# Patient Record
Sex: Female | Born: 1965 | Race: Black or African American | Hispanic: No | Marital: Single | Smoking: Never smoker
Health system: Southern US, Community
[De-identification: ages and names within clinical notes are randomized; demographics above are authoritative.]

## PROBLEM LIST (undated history)

## (undated) DIAGNOSIS — E049 Nontoxic goiter, unspecified: Secondary | ICD-10-CM

## (undated) HISTORY — PX: ABLATION ON ENDOMETRIOSIS: SHX5787

---

## 1998-03-31 ENCOUNTER — Other Ambulatory Visit: Admission: RE | Admit: 1998-03-31 | Discharge: 1998-03-31 | Payer: Self-pay | Admitting: Family Medicine

## 1998-09-28 ENCOUNTER — Emergency Department (HOSPITAL_COMMUNITY): Admission: EM | Admit: 1998-09-28 | Discharge: 1998-09-28 | Payer: Self-pay | Admitting: Emergency Medicine

## 1998-10-11 ENCOUNTER — Other Ambulatory Visit: Admission: RE | Admit: 1998-10-11 | Discharge: 1998-10-11 | Payer: Self-pay | Admitting: Obstetrics

## 1999-04-05 ENCOUNTER — Other Ambulatory Visit: Admission: RE | Admit: 1999-04-05 | Discharge: 1999-04-05 | Payer: Self-pay | Admitting: Obstetrics and Gynecology

## 1999-04-09 ENCOUNTER — Encounter (INDEPENDENT_AMBULATORY_CARE_PROVIDER_SITE_OTHER): Payer: Self-pay | Admitting: *Deleted

## 1999-04-09 ENCOUNTER — Ambulatory Visit (HOSPITAL_COMMUNITY): Admission: RE | Admit: 1999-04-09 | Discharge: 1999-04-09 | Payer: Self-pay | Admitting: Obstetrics and Gynecology

## 1999-10-07 ENCOUNTER — Encounter: Payer: Self-pay | Admitting: Emergency Medicine

## 1999-10-07 ENCOUNTER — Emergency Department (HOSPITAL_COMMUNITY): Admission: EM | Admit: 1999-10-07 | Discharge: 1999-10-07 | Payer: Self-pay | Admitting: Emergency Medicine

## 2000-05-16 ENCOUNTER — Other Ambulatory Visit: Admission: RE | Admit: 2000-05-16 | Discharge: 2000-05-16 | Payer: Self-pay | Admitting: Obstetrics and Gynecology

## 2000-07-09 ENCOUNTER — Ambulatory Visit (HOSPITAL_COMMUNITY): Admission: RE | Admit: 2000-07-09 | Discharge: 2000-07-09 | Payer: Self-pay | Admitting: Obstetrics and Gynecology

## 2000-11-27 ENCOUNTER — Inpatient Hospital Stay (HOSPITAL_COMMUNITY): Admission: AD | Admit: 2000-11-27 | Discharge: 2000-11-29 | Payer: Self-pay | Admitting: Obstetrics and Gynecology

## 2001-01-14 ENCOUNTER — Ambulatory Visit (HOSPITAL_COMMUNITY): Admission: RE | Admit: 2001-01-14 | Discharge: 2001-01-14 | Payer: Self-pay | Admitting: Obstetrics and Gynecology

## 2004-10-07 ENCOUNTER — Ambulatory Visit (HOSPITAL_COMMUNITY): Admission: RE | Admit: 2004-10-07 | Discharge: 2004-10-07 | Payer: Self-pay | Admitting: Obstetrics & Gynecology

## 2005-02-12 ENCOUNTER — Emergency Department (HOSPITAL_COMMUNITY): Admission: EM | Admit: 2005-02-12 | Discharge: 2005-02-12 | Payer: Self-pay | Admitting: Family Medicine

## 2005-05-23 ENCOUNTER — Emergency Department (HOSPITAL_COMMUNITY): Admission: EM | Admit: 2005-05-23 | Discharge: 2005-05-23 | Payer: Self-pay | Admitting: Family Medicine

## 2005-06-02 ENCOUNTER — Emergency Department (HOSPITAL_COMMUNITY): Admission: EM | Admit: 2005-06-02 | Discharge: 2005-06-02 | Payer: Self-pay | Admitting: Emergency Medicine

## 2005-07-18 ENCOUNTER — Emergency Department (HOSPITAL_COMMUNITY): Admission: EM | Admit: 2005-07-18 | Discharge: 2005-07-18 | Payer: Self-pay | Admitting: Family Medicine

## 2005-07-30 ENCOUNTER — Emergency Department (HOSPITAL_COMMUNITY): Admission: EM | Admit: 2005-07-30 | Discharge: 2005-07-30 | Payer: Self-pay | Admitting: Family Medicine

## 2006-02-15 ENCOUNTER — Emergency Department (HOSPITAL_COMMUNITY): Admission: EM | Admit: 2006-02-15 | Discharge: 2006-02-15 | Payer: Self-pay | Admitting: Emergency Medicine

## 2006-03-16 ENCOUNTER — Emergency Department (HOSPITAL_COMMUNITY): Admission: EM | Admit: 2006-03-16 | Discharge: 2006-03-16 | Payer: Self-pay | Admitting: Emergency Medicine

## 2006-09-08 ENCOUNTER — Emergency Department (HOSPITAL_COMMUNITY): Admission: EM | Admit: 2006-09-08 | Discharge: 2006-09-08 | Payer: Self-pay | Admitting: Family Medicine

## 2007-01-30 ENCOUNTER — Emergency Department (HOSPITAL_COMMUNITY): Admission: EM | Admit: 2007-01-30 | Discharge: 2007-01-30 | Payer: Self-pay | Admitting: Emergency Medicine

## 2007-04-04 ENCOUNTER — Encounter (INDEPENDENT_AMBULATORY_CARE_PROVIDER_SITE_OTHER): Payer: Self-pay | Admitting: Obstetrics & Gynecology

## 2007-04-04 ENCOUNTER — Ambulatory Visit: Payer: Self-pay | Admitting: Obstetrics & Gynecology

## 2007-06-06 ENCOUNTER — Emergency Department (HOSPITAL_COMMUNITY): Admission: EM | Admit: 2007-06-06 | Discharge: 2007-06-06 | Payer: Self-pay | Admitting: Emergency Medicine

## 2007-09-25 ENCOUNTER — Emergency Department (HOSPITAL_COMMUNITY): Admission: EM | Admit: 2007-09-25 | Discharge: 2007-09-25 | Payer: Self-pay | Admitting: Emergency Medicine

## 2008-03-13 ENCOUNTER — Other Ambulatory Visit: Admission: RE | Admit: 2008-03-13 | Discharge: 2008-03-13 | Payer: Self-pay | Admitting: Family Medicine

## 2008-08-27 ENCOUNTER — Emergency Department (HOSPITAL_COMMUNITY): Admission: EM | Admit: 2008-08-27 | Discharge: 2008-08-28 | Payer: Self-pay | Admitting: Emergency Medicine

## 2010-04-24 ENCOUNTER — Emergency Department (HOSPITAL_COMMUNITY): Admission: EM | Admit: 2010-04-24 | Discharge: 2010-04-24 | Payer: Self-pay | Admitting: Family Medicine

## 2010-08-07 ENCOUNTER — Emergency Department (HOSPITAL_COMMUNITY)
Admission: EM | Admit: 2010-08-07 | Discharge: 2010-08-07 | Payer: Self-pay | Source: Home / Self Care | Admitting: Family Medicine

## 2010-10-06 ENCOUNTER — Ambulatory Visit (HOSPITAL_COMMUNITY)
Admission: RE | Admit: 2010-10-06 | Discharge: 2010-10-06 | Payer: Self-pay | Source: Home / Self Care | Attending: Obstetrics and Gynecology | Admitting: Obstetrics and Gynecology

## 2010-12-19 LAB — CBC
HCT: 41.1 % (ref 36.0–46.0)
MCHC: 32.4 g/dL (ref 30.0–36.0)
Platelets: 217 10*3/uL (ref 150–400)
RBC: 5.29 MIL/uL — ABNORMAL HIGH (ref 3.87–5.11)
RDW: 14.6 % (ref 11.5–15.5)
WBC: 6.8 10*3/uL (ref 4.0–10.5)

## 2010-12-19 LAB — T3, FREE: T3, Free: 3.3 pg/mL (ref 2.3–4.2)

## 2010-12-19 LAB — PREGNANCY, URINE: Preg Test, Ur: NEGATIVE

## 2010-12-19 LAB — T4, FREE: Free T4: 1.04 ng/dL (ref 0.80–1.80)

## 2011-02-21 NOTE — Group Therapy Note (Signed)
Tiffany Moyer, MANOCCHIO NO.:  [id number]   MEDICAL RECORD NO.:  [id number]          PATIENT TYPE:  WOC   LOCATION:  [facility]                   FACILITY:  [facility]   PHYSICIAN:  Dorthula Perfect, MD     DATE OF BIRTH:  1966-10-06   DATE OF SERVICE:  04/04/2007                                  CLINIC NOTE   This is a 45 year old black female, gravida 3, para 3, last menstrual  period May of 2007.  She is here for annual exam.  Her complaint is that  of vaginal irritation.   She has had tubal sterilization done.  Menstrual periods are not a  problem.  She, however, does have a lot of vaginal irritation.  Certain  soaps irritate her, etc.   PHYSICAL EXAMINATION:  Height 5 feet 4 inches, weight 164, blood  pressure 104/68.  ABDOMEN:  Soft, nontender.  No masses felt.  PELVIC EXAM:  External genitalia normal.  There are no lesions on the  vulva.  Vaginal vault epithelized.  There is a little bit of a white  discharge.  Cervic is epithelized.  Uterus is of normal size and shape.  Adnexa structures are normal.   Wet prep is done.  It shows a few clue cells.   DISPOSITION:  1. Vaginal irritation.  Bacterial vaginosis.  2. Thin prep Pap smear.  3. Prescription for metronidazole 500 mg tablets 14 to take one twice      a day.  4. The patient is advised to stop using dryer sheets, instead use      fabric softener liquid.  She is advised to find a soap that does      not irritate her and stick with that.  She is to be sure to use      cotton-crotch underwear, and to keep all chemicals away from her      bottom.           ______________________________  Dorthula Perfect, MD     ER/MEDQ  D:  04/04/2007  T:  04/04/2007  Job:  [id number]

## 2011-02-24 NOTE — Op Note (Signed)
[facility]  Patient:    Tiffany Moyer, Tiffany Moyer               MRN: [id number] Proc. Date: 01/14/01 Adm. Date:  [id number] Attending:  Michaele Offer                           Operative Report  PREOPERATIVE DIAGNOSIS:  Desired surgical sterility.  POSTOPERATIVE DIAGNOSES:  Desired surgical sterility and pelvic and abdominal adhesions.  PROCEDURE:  Laparoscopic bilateral tubal fulguration.  SURGEON:  Dr. Lavina Hamman.  ANESTHESIA:  General endotracheal tube.  ESTIMATED BLOOD LOSS:  Less than 50 cc.  FINDINGS:  Normal uterus, tubes and ovaries with filmy adhesions of the tubes to the ovaries. The sigmoid colon was adherent to the left pelvic side wall and there were adhesions around her liver and she had a normal gallbladder.  COUNTS:  Correct.  CONDITION:  Stable.  DESCRIPTION OF PROCEDURE:  The patient was taken to the operating room and placed in the dorsal supine position. General anesthesia was induced and she was placed in mobile stirrups. Her abdomen was then prepped and draped in the usual sterile fashion for a laparoscopic procedure, her bladder drained with a red rubber catheter, and a Hulka tenaculum applied to her cervix for uterine manipulation. Her infraumbilical skin was then infiltrated with 0.25% Marcaine along her previous scar. A 1.5 cm horizontal incision was made in this previous scar and the Veress needle was inserted into the peritoneal cavity. Placement confirmed by the water drop test and an opening pressure of 4 mmHg. Two and a half liters of CO2 gas were insufflated and the Veress needle was removed. The 10/11 disposable trocar was then introduced and placement confirmed by the laparoscope. Inspection of the abdomen and pelvis revealed the above mentioned findings. Both fallopian tubes were identified and traced to their fimbriated ends. The middle portion of each fallopian tube was desiccated with a bipolar  cautery to achieve tubal fulguration. This was done in three spots to desiccate a 2-3 cm portion of the middle of each tube. This was done with good results and was done in all spots until the amp meter read zero. There was no bleeding. The procedure was then terminated. All gas was allowed to deflate from the abdomen and the trocar was removed. The fascia was reapproximated with #0 Vicryl and the skin reapproximated with interrupted subcuticular sutures of 4-0 Vicryl followed by Steri-Strips and band-aids. The Hulka tenaculum was then removed. The patient was extubated in the operating room and taken to the recovery room in stable condition after tolerating the procedure well. DD:  01/14/01 TD:  01/14/01 Job: [id number]

## 2011-02-24 NOTE — Discharge Summary (Signed)
[facility]  Patient:    Tiffany Moyer, Tiffany Moyer                      MRN: [id number] Adm. Date:  [id number] Disc. Date: 11/29/00 Attending:  Michaele Offer                           Discharge Summary  ADMISSION DIAGNOSES:          Intrauterine pregnancy at 38 weeks, group B strep carrier, advanced maternal age.  DISCHARGE DIAGNOSES:          Intrauterine pregnancy at 38 weeks, group B strep carrier, advanced maternal age.  PROCEDURE:                    Spontaneous vaginal delivery.  COMPLICATIONS:                None.  CONSULTATIONS:                None.  HISTORY AND PHYSICAL:         This is a 45 year old black female gravida 5, para 2-0-2-2 with an EGA of [redacted] weeks by an LMP consistent with an 8 week ultrasound with an EDC of March 2 who presents with complaint of regular contractions, no vaginal bleeding or ruptured membranes, and good fetal movement.  Vaginal examination in the office revealed her to be 3, 80, and -1. Prenatal care complicated by a first trimester UTI with Klebsiella which was treated with Bactrim and she had a negative test of cure.  She had a questionable exposure to parvo virus B19 with negative IgG and IgM titers x 2. She is advanced maternal age and declined amniocentesis and had a normal ultrasound and normal triple screen.  PRENATAL LABORATORIES:        Blood type A+ with a negative antibody screen. Sickle screen is negative.  RPR nonreactive.  Rubella immune.  Hepatitis B surface antigen negative.  HIV negative.  Gonorrhea and chlamydia negative. Triple screen normal.  One hour glucola 80.  Strep is positive.  PAST OBSTETRICAL HISTORY:     In 1994 vaginal delivery at 39 weeks 7 pounds 9 ounces, 1995 vaginal delivery at 39 weeks 8 pounds 2 ounces.  She had spontaneous abortions in 1999 and 2000.  PAST GYNECOLOGICAL HISTORY:   Remote history of gonorrhea and chlamydia. Remainder of her history is  noncontributory.  FAMILY HISTORY:               Father of the baby and one of the patients sons has hemoglobin AS.  PHYSICAL EXAMINATION:  VITAL SIGNS:                  Afebrile with stable vital signs.  Fetal heart tracing was reactive without decelerations and she was having contractions q.3-4 minutes.  ABDOMEN:                      Gravid, nontender.  Estimated fetal weight 7.5 pounds.  PELVIC:                       Vaginal examination:  3, 80, -1 with a vertex presentation and an adequate pelvis.  HOSPITAL COURSE:              Patient was admitted and started on penicillin for group B strep prophylaxis.  On my first examination  in the hospital artificial rupture of membranes was performed which revealed clear amniotic fluid.  She then required Pitocin augmentation and received an epidural.  She then progressed to complete and pushed well.  On the evening of February 19 she had a vaginal delivery of a viable female infant with Apgars of 9 and 9 that weighed 7 pounds 10 ounces over an intact perineum.  There was a nuchal cord x  which was reduced.  Placenta was spontaneous and was intact.  Perineum was intact.  Estimated blood loss was less than 500 cc.  Postpartum she did very well.  Bottle fed her baby without complications.  She had an initial temperature on the floor of 100.4, but other than that remained afebrile.  On the morning of postpartum day #2 she was felt to be stable enough for discharge home.  CONDITION ON DISCHARGE:       Stable.  DISPOSITION:                  Discharged to home.  DIET:                         Regular.  ACTIVITY:                     Pelvic rest.  FOLLOW-UP:                    Four to six weeks.  DISCHARGE MEDICATIONS:        Over-the-counter Tylenol and Motrin p.r.n.  She is given our discharge pamphlet.DD:  11/29/00 TD:  11/29/00 Job: [id number]

## 2011-07-12 LAB — URINE MICROSCOPIC-ADD ON

## 2011-07-12 LAB — URINALYSIS, ROUTINE W REFLEX MICROSCOPIC
Protein, ur: 30 — AB
Specific Gravity, Urine: 1.023
pH: 6

## 2011-07-12 LAB — PREGNANCY, URINE: Preg Test, Ur: NEGATIVE

## 2011-07-21 LAB — DIFFERENTIAL
Lymphs Abs: 1.3
Monocytes Absolute: 0.4
Monocytes Relative: 7
Neutro Abs: 4.8

## 2011-07-21 LAB — BASIC METABOLIC PANEL
BUN: 10
Calcium: 9.3
Creatinine, Ser: 0.76
GFR calc Af Amer: >60
Glucose, Bld: 88
Sodium: 140

## 2011-07-21 LAB — CBC
MCV: 78
Platelets: 227
RBC: 4.85
RDW: 14.3 — ABNORMAL HIGH
WBC: 6.6

## 2011-07-21 LAB — URINALYSIS, ROUTINE W REFLEX MICROSCOPIC
Leukocytes, UA: NEGATIVE
Nitrite: NEGATIVE
Protein, ur: NEGATIVE
Specific Gravity, Urine: 1.009
Urobilinogen, UA: 1

## 2011-07-21 LAB — URINE MICROSCOPIC-ADD ON

## 2011-07-21 LAB — PREGNANCY, URINE: Preg Test, Ur: NEGATIVE

## 2012-08-09 ENCOUNTER — Encounter (HOSPITAL_COMMUNITY): Payer: Self-pay | Admitting: Emergency Medicine

## 2012-08-09 ENCOUNTER — Emergency Department (INDEPENDENT_AMBULATORY_CARE_PROVIDER_SITE_OTHER)
Admission: EM | Admit: 2012-08-09 | Discharge: 2012-08-09 | Disposition: A | Discharge status: Home / Self Care | Payer: Self-pay | Source: Home / Self Care | Attending: Emergency Medicine | Admitting: Emergency Medicine

## 2012-08-09 DIAGNOSIS — E049 Nontoxic goiter, unspecified: Secondary | ICD-10-CM

## 2012-08-09 DIAGNOSIS — R3989 Other symptoms and signs involving the genitourinary system: Secondary | ICD-10-CM

## 2012-08-09 DIAGNOSIS — R399 Unspecified symptoms and signs involving the genitourinary system: Secondary | ICD-10-CM

## 2012-08-09 DIAGNOSIS — N898 Other specified noninflammatory disorders of vagina: Secondary | ICD-10-CM

## 2012-08-09 HISTORY — DX: Nontoxic goiter, unspecified: E04.9

## 2012-08-09 LAB — POCT URINALYSIS DIP (DEVICE)
Glucose, UA: NEGATIVE mg/dL
Nitrite: NEGATIVE
Protein, ur: NEGATIVE mg/dL
Urobilinogen, UA: 0.2 mg/dL (ref 0.0–1.0)

## 2012-08-09 LAB — POCT PREGNANCY, URINE: Preg Test, Ur: NEGATIVE

## 2012-08-09 MED ORDER — NITROFURANTOIN MONOHYD MACRO 100 MG PO CAPS: 100.0000 mg | ORAL_CAPSULE | Freq: Two times a day (BID) | ORAL | Status: AC

## 2012-08-09 MED ORDER — FLUCONAZOLE 200 MG PO TABS: 100.0000 mg | ORAL_TABLET | Freq: Once | ORAL | Status: DC

## 2012-08-09 MED ORDER — METRONIDAZOLE 500 MG PO TABS: 500.0000 mg | ORAL_TABLET | Freq: Two times a day (BID) | ORAL | Status: DC

## 2012-08-09 MED ORDER — FLUCONAZOLE 100 MG PO TABS: 100.0000 mg | ORAL_TABLET | Freq: Once | ORAL | Status: AC

## 2012-08-09 NOTE — ED Notes (Signed)
Instructed patient to slip on gown for physician exam.

## 2012-08-09 NOTE — ED Notes (Signed)
Patient reports she has a goiter diagnosed in march 2013. Patient has had neck pain since then . Patient was unable to go to 6 month check up ( insurance cancelled).  Neck pain has continued and now in shoulder, left chest, left upper back.  Reports pain is sharp, squeezing.  Patient is concerned goiter is larger than it was.  Second complaint is that she had a uterine ablation 2 years ago.  Lower abdominal cramping started 2-3 days ago.  Reports she has cramping for the past 2-3 months with no bleeding.

## 2012-08-09 NOTE — ED Notes (Signed)
Equipment at bedside. 

## 2012-08-09 NOTE — ED Provider Notes (Addendum)
History     CSN: [id number]  Arrival date & time 08/09/12  1331   First MD Initiated Contact with Patient 08/09/12 1453      Chief Complaint  Patient presents with  . Neck Pain    (Consider location/radiation/quality/duration/timing/severity/associated sxs/prior treatment) HPI Comments: Patient presents to urgent care this afternoon complaining of 3 different concerns and problems. Problem #1 is that lasts one to 2 weeks he describes a uncomfortable vaginal irritation and discharge with some discomfort and increased frequency with urination. She denies any fevers, flank pains nausea or vomiting. Second issue patient brings or attention is that she feels recorder that was diagnosed the beginning of this year is getting larger as she is continues to experience some neck discomfort since then. She has been seen by regional physician's group in [facility] but she has not come back for a followup appointment because of insurance lapse. She is aware that her office visit will cost her $120. " I thought of coming here because it was much cheaper". Patient also describes that she had a urine ablation 2 years ago and was told to followup with she never did. She denies any vaginal bleeding but does describe for the last 2 weeks have passed a vaginal discharge and irritation. Patient denies any systemic symptoms such as fevers general malaise. Although does admit that she's been feeling tired getting sick more frequently ( did not elaborate on how), but feels somewhat fatigued and tired, and most recently with mood swings.  Patient is a 46 y.o. female presenting with neck pain and vaginal discharge. The history is provided by the patient.  Neck Pain  This is a new problem. The problem occurs constantly.  Vaginal Discharge This is a new problem. The problem occurs constantly. The problem has not changed since onset.Pertinent negatives include no abdominal pain. The treatment provided no relief.    Past  Medical History  Diagnosis Date  . Goiter     Past Surgical History  Procedure Date  . Ablation on endometriosis     History reviewed. No pertinent family history.  History  Substance Use Topics  . Smoking status: Not on file  . Smokeless tobacco: Not on file  . Alcohol Use: No    OB History    Grav Para Term Preterm Abortions TAB SAB Ect Mult Living                  Review of Systems  Constitutional: Positive for activity change. Negative for fever and diaphoresis.  HENT: Positive for neck pain.   Gastrointestinal: Negative for abdominal pain.  Genitourinary: Positive for frequency and vaginal discharge. Negative for urgency, hematuria, decreased urine volume, vaginal pain and pelvic pain.  Musculoskeletal: Negative for back pain.  Skin: Negative for rash.    Allergies  Review of patient's allergies indicates no known allergies.  Home Medications   Current Outpatient Rx  Name Route Sig Dispense Refill  . NYQUIL PO Oral Take by mouth.    Marland Kitchen FLUCONAZOLE 100 MG PO TABS Oral Take 1 tablet (100 mg total) by mouth once. Can repeat treatment in 7 days 2 tablet 0  . METRONIDAZOLE 500 MG PO TABS Oral Take 1 tablet (500 mg total) by mouth 2 (two) times daily. 14 tablet 0  . NITROFURANTOIN MONOHYD MACRO 100 MG PO CAPS Oral Take 1 capsule (100 mg total) by mouth 2 (two) times daily. 10 capsule 0    BP 112/76  Pulse 73  Temp 97 F (  36.1 C) (Oral)  Resp 16  SpO2 100%  Physical Exam  Nursing note and vitals reviewed. Constitutional: Vital signs are normal. She appears well-developed and well-nourished.  Non-toxic appearance. She does not have a sickly appearance.  Genitourinary: There is no rash, tenderness or lesion on the right labia. There is no rash, tenderness or lesion on the left labia. Cervix exhibits no motion tenderness. No erythema, tenderness or bleeding around the vagina. No signs of injury around the vagina. Vaginal discharge found.    Skin: No rash noted.  No erythema.    ED Course  Procedures (including critical care time)  Labs Reviewed  POCT URINALYSIS DIP (DEVICE) - Abnormal; Notable for the following:    Hgb urine dipstick MODERATE (*)     Leukocytes, UA SMALL (*)  Biochemical Testing Only. Please order routine urinalysis from main lab if confirmatory testing is needed.   All other components within normal limits  POCT PREGNANCY, URINE  WET PREP, GENITAL  GC/CHLAMYDIA PROBE AMP, GENITAL  URINE CULTURE   No results found.   1. Vaginal discharge   2. Urinary tract infection symptoms   3. Goiter       MDM  Problem #1 previously diagnose thyroid goiter. Patient will call her primary care Dr. that had previously diagnose her and will agree to pay the $120 office visit. I have explained to patient that we would not be able to perform this workup that she needs and appetite he recommended that she continues with her primary care Dr. with this workup as previously recommended. At this point I don't think patient is having symptoms of hyperthyroid toxicosis, or is in any immediate danger although have explained to her that a malignant process in her thyroid needs to be excluded although unlikely. Problem #2 patient with urinary discomfort and vaginal discharge. A pelvic exam was performed where a whitish homogeneous with vaginal discharge was noted samples were obtained for both a wet prep and a DNA probe for chlamydia and gonorrhea. Patient will be treated with empiric O. course of Diflucan and Flagyl.  Problem #3 patient had a endometrial ablation, and was recommended to followup which she thought she could do here at urgent care today I have explained that to her that she needs to see the gynecologist to perform the endometrial ablation for follow-up. After this discussion patient agrees and will proceed to call her previous gynecologist.       Jimmie Molly, MD 08/09/12 1621  Jimmie Molly, MD 08/09/12 [phone]

## 2012-08-10 LAB — GC/CHLAMYDIA PROBE AMP, GENITAL: Chlamydia, DNA Probe: NEGATIVE

## 2012-08-12 NOTE — ED Notes (Signed)
UA culture shows no significant growth

## 2012-08-12 NOTE — ED Notes (Addendum)
Patient called, c/o perineal discomfort is worse, and what can she do for it. Spoke w Verline Lema, [place], who stated pt can use OTC  monistat externally for irritation and burning. Also discussed lab reports with patient; gc/chlamydis negative

## 2012-08-13 NOTE — ED Notes (Signed)
Wet prep: many yeast, few clue cells, many WBC's. Pt. adequately treated with  Diflucan and Flagyl. Other labs as noted previously. Vassie Moselle 08/13/2012

## 2013-06-19 ENCOUNTER — Emergency Department (HOSPITAL_COMMUNITY): Payer: Medicaid Other

## 2013-06-19 ENCOUNTER — Emergency Department (HOSPITAL_COMMUNITY)
Admission: EM | Admit: 2013-06-19 | Discharge: 2013-06-19 | Disposition: A | Discharge status: Home / Self Care | Payer: Medicaid Other | Attending: Emergency Medicine | Admitting: Emergency Medicine

## 2013-06-19 ENCOUNTER — Encounter (HOSPITAL_COMMUNITY): Payer: Self-pay | Admitting: Emergency Medicine

## 2013-06-19 DIAGNOSIS — M25512 Pain in left shoulder: Secondary | ICD-10-CM

## 2013-06-19 DIAGNOSIS — Z862 Personal history of diseases of the blood and blood-forming organs and certain disorders involving the immune mechanism: Secondary | ICD-10-CM | POA: Insufficient documentation

## 2013-06-19 DIAGNOSIS — M7918 Myalgia, other site: Secondary | ICD-10-CM

## 2013-06-19 DIAGNOSIS — IMO0001 Reserved for inherently not codable concepts without codable children: Secondary | ICD-10-CM | POA: Insufficient documentation

## 2013-06-19 DIAGNOSIS — Z8639 Personal history of other endocrine, nutritional and metabolic disease: Secondary | ICD-10-CM | POA: Insufficient documentation

## 2013-06-19 DIAGNOSIS — R079 Chest pain, unspecified: Secondary | ICD-10-CM | POA: Insufficient documentation

## 2013-06-19 DIAGNOSIS — M25519 Pain in unspecified shoulder: Secondary | ICD-10-CM | POA: Insufficient documentation

## 2013-06-19 DIAGNOSIS — Z79899 Other long term (current) drug therapy: Secondary | ICD-10-CM | POA: Insufficient documentation

## 2013-06-19 LAB — CBC WITH DIFFERENTIAL/PLATELET
Eosinophils Relative: 3 % (ref 0–5)
HCT: 44.2 % (ref 36.0–46.0)
Hemoglobin: 14.9 g/dL (ref 12.0–15.0)
Lymphocytes Relative: 30 % (ref 12–46)
Lymphs Abs: 2.5 10*3/uL (ref 0.7–4.0)
MCV: 78.5 fL (ref 78.0–100.0)
Monocytes Absolute: 0.5 10*3/uL (ref 0.1–1.0)
Monocytes Relative: 6 % (ref 3–12)
Platelets: 216 10*3/uL (ref 150–400)
RBC: 5.63 MIL/uL — ABNORMAL HIGH (ref 3.87–5.11)
WBC: 8.4 10*3/uL (ref 4.0–10.5)

## 2013-06-19 LAB — BASIC METABOLIC PANEL
CO2: 26 mEq/L (ref 19–32)
Calcium: 10.2 mg/dL (ref 8.4–10.5)
Glucose, Bld: 88 mg/dL (ref 70–99)
Sodium: 137 mEq/L (ref 135–145)

## 2013-06-19 LAB — POCT I-STAT TROPONIN I

## 2013-06-19 MED ORDER — IBUPROFEN 600 MG PO TABS: 600.0000 mg | ORAL_TABLET | Freq: Four times a day (QID) | ORAL | Status: DC | PRN

## 2013-06-19 MED ORDER — KETOROLAC TROMETHAMINE 30 MG/ML IJ SOLN
30.0000 mg | Freq: Once | INTRAMUSCULAR | Status: AC
  Administered 2013-06-19: 30 mg via INTRAVENOUS
  Filled 2013-06-19: qty 1

## 2013-06-19 MED ORDER — HYDROCODONE-ACETAMINOPHEN 5-325 MG PO TABS
1.0000 | ORAL_TABLET | Freq: Once | ORAL | Status: DC
  Filled 2013-06-19: qty 1
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
## 2013-06-19 NOTE — ED Provider Notes (Signed)
CSN: [id number]     Arrival date & time 06/19/13  1246 History   First MD Initiated Contact with Patient 06/19/13 1328     Chief Complaint  Patient presents with  . Arm Pain   (Consider location/radiation/quality/duration/timing/severity/associated sxs/prior Treatment) HPI 47 year old female with no significant past medical history the presents for complaint of left shoulder and left chest pain. She also reports that the pain radiates to her left arm. The shoulder pain has been present for approximately 2 months. She has been taking aspirin for relief. Today she reports that this pain was much worse and that she also had left-sided chest pain, which concerned her and prompted her to come to the emergency department. She was lying in bed when she noticed the pain this morning. She describes it as a "spasm" and feels like her chest is "squeezing up tight". 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 She denies nausea, diaphoresis, vomiting, cough, fevers, chills. No personal history of DVT, family history of hypercoagulability or family history of coronary artery disease.  Past Medical History  Diagnosis Date  . Goiter    Past Surgical History  Procedure Laterality Date  . Ablation on endometriosis     No family history on file. History  Substance Use Topics  . Smoking status: Never Smoker   . Smokeless tobacco: Not on file  . Alcohol Use: No   OB History   Grav Para Term Preterm Abortions TAB SAB Ect Mult Living                 Review of Systems  Constitutional: Negative for fever and chills.  HENT: Negative for neck pain and neck stiffness.   Respiratory: Negative for cough.   Cardiovascular: Negative for palpitations and leg swelling.  Gastrointestinal: Negative for nausea, vomiting, abdominal pain and diarrhea.  Genitourinary: Negative for dysuria and frequency.  Musculoskeletal: Negative for back pain.  Neurological: Negative for numbness.  All other systems reviewed and are  negative.    Allergies  Review of patient's allergies indicates no known allergies.  Home Medications   Current Outpatient Rx  Name  Route  Sig  Dispense  Refill  . metroNIDAZOLE (FLAGYL) 500 MG tablet   Oral   Take 1 tablet (500 mg total) by mouth 2 (two) times daily.   14 tablet   0   . Pseudoeph-Doxylamine-DM-APAP (NYQUIL PO)   Oral   Take by mouth.          BP 117/68  Pulse 82  Temp(Src) 98.3 F (36.8 C)  Resp 16  SpO2 98% Physical Exam  Vitals reviewed. Constitutional: She is oriented to person, place, and time. She appears well-developed and well-nourished. No distress.  HENT:  Right Ear: External ear normal.  Left Ear: External ear normal.  Mouth/Throat: No oropharyngeal exudate.  Eyes: Conjunctivae and EOM are normal. Pupils are equal, round, and reactive to light.  Neck: Normal range of motion. Neck supple.  Cardiovascular: Normal rate, regular rhythm, normal heart sounds and intact distal pulses.  Exam reveals no gallop and no friction rub.   No murmur heard. Pulmonary/Chest: Effort normal and breath sounds normal. She exhibits no tenderness.  Abdominal: Soft. Bowel sounds are normal. She exhibits no distension. There is no tenderness. There is no rebound and no guarding.  Musculoskeletal: Normal range of motion. She exhibits no edema.       Arms: Neurological: She is alert and oriented to person, place, and time. No cranial nerve deficit.  Skin: Skin  is warm and dry. No rash noted. She is not diaphoretic.  Psychiatric: She has a normal mood and affect.    ED Course  Procedures (including critical care time) Labs Review Labs Reviewed  CBC WITH DIFFERENTIAL - Abnormal; Notable for the following:    RBC 5.63 (*)    All other components within normal limits  BASIC METABOLIC PANEL - Abnormal; Notable for the following:    GFR calc non Af Amer 74 (*)    GFR calc Af Amer 86 (*)    All other components within normal limits  POCT I-STAT TROPONIN I    Imaging Review Dg Chest 2 View  06/19/2013   CLINICAL DATA:  Arm pain, left shoulder pain, cough, congestion.  EXAM: CHEST  2 VIEW  COMPARISON:  06/06/2007  FINDINGS: The heart size and mediastinal contours are within normal limits. Both lungs are clear. The visualized skeletal structures are unremarkable.  IMPRESSION: No active cardiopulmonary disease.   Electronically Signed   By: Charlett Nose M.D.   On: 06/19/2013 15:16     Date: 06/19/2013  Rate: 75  Rhythm: normal sinus rhythm  QRS Axis: normal  Intervals: normal  ST/T Wave abnormalities: normal  Conduction Disutrbances:none  Narrative Interpretation: NSR, normal EKG  Old EKG Reviewed: none available   MDM   47 year old female here with scapula/shoulder pain x 2 months, now with vague left sided chest pain today.  Afebrile.  VSS.  Well appearing.  Reproducible pain on exam.    Poor story for ACS, HEART score is 2 (1 point each for questionable history and age).  PERC negative.  It is felt this is likely musculoskeletal pain.    CBC, BMP, troponin ordered per protocol. EKG and chest x-ray. UPT with Toradol to follow if negative.Pain has been constant with no relief since this AM so a single trop is felt to be adequate.  3:50 PM The pt feels better after Toradol.  It is felt the pt is stable for discharge with PCP f/u.  Return precautions reviewed.   Clinical Impression: 1. Shoulder pain, left   2. Musculoskeletal pain     Disposition: Discharge  Condition: Good  I have discussed the results, Dx and Tx plan. They expressed understanding and agree with the plan and were told to return to ED with any worsening of condition or concern.    Discharge Medication List as of 06/19/2013  3:25 PM    START taking these medications   Details  ibuprofen (ADVIL,MOTRIN) 600 MG tablet Take 1 tablet (600 mg total) by mouth every 6 (six) hours as needed for pain., Starting 06/19/2013, Until Discontinued, Print        Follow  Up: Followup with your regular doctor      Pt seen in conjunction with Dr. Romeo Apple.  Reine Just. Beverely Pace, MD Emergency Medicine PGY-III [phone]    Oleh Genin, MD 06/19/13 2236

## 2013-06-20 NOTE — ED Provider Notes (Signed)
Medical screening examination/treatment/procedure(s) were conducted as a shared visit with resident physician and myself.  I personally evaluated the patient during the encounter.  I interviewed and examined the patient. Lungs are CTAB. Cardiac exam wnl. Abdomen soft. Pain is reproducible w/ palpation of left scapular area on exam. Atypical for cardiac pain. Low risk for MACE per HEART score. Trop neg. Will rec close outpt f/u. Pt understands.   Junius Argyle, MD 06/20/13 [phone]

## 2014-06-01 ENCOUNTER — Emergency Department (HOSPITAL_COMMUNITY)
Admission: EM | Admit: 2014-06-01 | Discharge: 2014-06-01 | Disposition: A | Discharge status: Home / Self Care | Payer: Medicaid Other | Attending: Emergency Medicine | Admitting: Emergency Medicine

## 2014-06-01 ENCOUNTER — Emergency Department (HOSPITAL_COMMUNITY): Payer: Medicaid Other

## 2014-06-01 ENCOUNTER — Encounter (HOSPITAL_COMMUNITY): Payer: Self-pay | Admitting: Emergency Medicine

## 2014-06-01 DIAGNOSIS — E869 Volume depletion, unspecified: Secondary | ICD-10-CM | POA: Diagnosis not present

## 2014-06-01 DIAGNOSIS — Z79899 Other long term (current) drug therapy: Secondary | ICD-10-CM | POA: Diagnosis not present

## 2014-06-01 DIAGNOSIS — Z862 Personal history of diseases of the blood and blood-forming organs and certain disorders involving the immune mechanism: Secondary | ICD-10-CM | POA: Diagnosis not present

## 2014-06-01 DIAGNOSIS — R52 Pain, unspecified: Secondary | ICD-10-CM | POA: Insufficient documentation

## 2014-06-01 DIAGNOSIS — IMO0001 Reserved for inherently not codable concepts without codable children: Secondary | ICD-10-CM | POA: Insufficient documentation

## 2014-06-01 DIAGNOSIS — Z3202 Encounter for pregnancy test, result negative: Secondary | ICD-10-CM | POA: Diagnosis not present

## 2014-06-01 DIAGNOSIS — M791 Myalgia, unspecified site: Secondary | ICD-10-CM

## 2014-06-01 DIAGNOSIS — N390 Urinary tract infection, site not specified: Secondary | ICD-10-CM | POA: Diagnosis not present

## 2014-06-01 DIAGNOSIS — Z8639 Personal history of other endocrine, nutritional and metabolic disease: Secondary | ICD-10-CM | POA: Insufficient documentation

## 2014-06-01 LAB — COMPREHENSIVE METABOLIC PANEL
ALBUMIN: 3.8 g/dL (ref 3.5–5.2)
ALT: 44 U/L — ABNORMAL HIGH (ref 0–35)
ANION GAP: 11 (ref 5–15)
AST: 41 U/L — AB (ref 0–37)
Alkaline Phosphatase: 69 U/L (ref 39–117)
BILIRUBIN TOTAL: 0.2 mg/dL — AB (ref 0.3–1.2)
BUN: 17 mg/dL (ref 6–23)
CALCIUM: 9.3 mg/dL (ref 8.4–10.5)
CHLORIDE: 102 meq/L (ref 96–112)
CO2: 24 mEq/L (ref 19–32)
CREATININE: 0.99 mg/dL (ref 0.50–1.10)
GFR calc Af Amer: 77 mL/min — ABNORMAL LOW (ref >90)
GFR calc non Af Amer: 66 mL/min — ABNORMAL LOW (ref >90)
Glucose, Bld: 104 mg/dL — ABNORMAL HIGH (ref 70–99)
Potassium: 4 mEq/L (ref 3.7–5.3)
Sodium: 137 mEq/L (ref 137–147)
TOTAL PROTEIN: 7.1 g/dL (ref 6.0–8.3)

## 2014-06-01 LAB — URINALYSIS, ROUTINE W REFLEX MICROSCOPIC
Bilirubin Urine: NEGATIVE
GLUCOSE, UA: NEGATIVE mg/dL
KETONES UR: NEGATIVE mg/dL
Nitrite: NEGATIVE
PROTEIN: NEGATIVE mg/dL
Specific Gravity, Urine: 1.021 (ref 1.005–1.030)
UROBILINOGEN UA: 1 mg/dL (ref 0.0–1.0)
pH: 7 (ref 5.0–8.0)

## 2014-06-01 LAB — CBC WITH DIFFERENTIAL/PLATELET
BASOS ABS: 0.1 10*3/uL (ref 0.0–0.1)
BASOS PCT: 1 % (ref 0–1)
EOS ABS: 0.4 10*3/uL (ref 0.0–0.7)
EOS PCT: 4 % (ref 0–5)
HEMATOCRIT: 39.5 % (ref 36.0–46.0)
HEMOGLOBIN: 13.2 g/dL (ref 12.0–15.0)
Lymphocytes Relative: 29 % (ref 12–46)
Lymphs Abs: 2.5 10*3/uL (ref 0.7–4.0)
MCH: 25.8 pg — AB (ref 26.0–34.0)
MCHC: 33.4 g/dL (ref 30.0–36.0)
MCV: 77.1 fL — AB (ref 78.0–100.0)
MONO ABS: 0.7 10*3/uL (ref 0.1–1.0)
MONOS PCT: 8 % (ref 3–12)
Neutro Abs: 5.1 10*3/uL (ref 1.7–7.7)
Neutrophils Relative %: 58 % (ref 43–77)
Platelets: 201 10*3/uL (ref 150–400)
RBC: 5.12 MIL/uL — ABNORMAL HIGH (ref 3.87–5.11)
RDW: 15 % (ref 11.5–15.5)
WBC: 8.7 10*3/uL (ref 4.0–10.5)

## 2014-06-01 LAB — URINE MICROSCOPIC-ADD ON

## 2014-06-01 LAB — POC URINE PREG, ED: PREG TEST UR: NEGATIVE

## 2014-06-01 MED ORDER — CEPHALEXIN 250 MG PO CAPS
500.0000 mg | ORAL_CAPSULE | Freq: Once | ORAL | Status: AC
  Administered 2014-06-01: 500 mg via ORAL
  Filled 2014-06-01: qty 2

## 2014-06-01 MED ORDER — KETOROLAC TROMETHAMINE 30 MG/ML IJ SOLN
30.0000 mg | Freq: Once | INTRAMUSCULAR | Status: AC
  Administered 2014-06-01: 30 mg via INTRAVENOUS
  Filled 2014-06-01: qty 1

## 2014-06-01 MED ORDER — CEPHALEXIN 500 MG PO CAPS: 500.0000 mg | ORAL_CAPSULE | Freq: Three times a day (TID) | ORAL | Status: DC

## 2014-06-01 MED ORDER — SODIUM CHLORIDE 0.9 % IV BOLUS (SEPSIS)
1000.0000 mL | Freq: Once | INTRAVENOUS | Status: AC
  Administered 2014-06-01: 1000 mL via INTRAVENOUS

## 2014-06-01 MED ORDER — ACETAMINOPHEN 500 MG PO TABS
1000.0000 mg | ORAL_TABLET | Freq: Once | ORAL | Status: AC
  Administered 2014-06-01: 1000 mg via ORAL
  Filled 2014-06-01: qty 2

## 2014-06-01 NOTE — ED Provider Notes (Signed)
CSN: [id number]     Arrival date & time 06/01/14  0048 History   First MD Initiated Contact with Patient 06/01/14 0050     Chief Complaint  Patient presents with  . Generalized Body Aches      HPI Patient reports worsening congestion and myalgias over the past 6 several days.  She's had these symptoms for about 2 weeks.  She reports urinary frequency and dysuria.  She denies vaginal complaints.  No abdominal pain.  Denies flank pain.  Denies nausea vomiting diarrhea.  Denies shortness of breath.  She has sinus pressure.  She denies sore throat.  Symptoms are mild to moderate in severity   Past Medical History  Diagnosis Date  . Goiter    Past Surgical History  Procedure Laterality Date  . Ablation on endometriosis     No family history on file. History  Substance Use Topics  . Smoking status: Never Smoker   . Smokeless tobacco: Not on file  . Alcohol Use: No   OB History   Grav Para Term Preterm Abortions TAB SAB Ect Mult Living                 Review of Systems  All other systems reviewed and are negative.     Allergies  Review of patient's allergies indicates no known allergies.  Home Medications   Prior to Admission medications   Medication Sig Start Date End Date Taking? Authorizing Provider  cetirizine (ZYRTEC) 10 MG tablet Take 10 mg by mouth daily.   Yes Historical Provider, MD  diphenhydrAMINE (BENADRYL) 25 mg capsule Take 25 mg by mouth every 6 (six) hours as needed for allergies.   Yes Historical Provider, MD  fluticasone (FLONASE) 50 MCG/ACT nasal spray Place 1 spray into both nostrils daily as needed for allergies or rhinitis.   Yes Historical Provider, MD  GuaiFENesin (MUCINEX PO) Take 15 mLs by mouth 2 (two) times daily as needed (cold).   Yes Historical Provider, MD  Multiple Vitamin (MULTIVITAMIN WITH MINERALS) TABS tablet Take 1 tablet by mouth every other day.    Yes Historical Provider, MD  naproxen sodium (ANAPROX) 220 MG tablet Take 220 mg by  mouth daily as needed (pain).   Yes Historical Provider, MD  Pseudoeph-Doxylamine-DM-APAP (NYQUIL PO) Take 1 tablet by mouth at bedtime as needed (cold).   Yes Historical Provider, MD  Pseudoephedrine-APAP-DM (DAYQUIL PO) Take 1 tablet by mouth every 4 (four) hours as needed (cold).   Yes Historical Provider, MD  cephALEXin (KEFLEX) 500 MG capsule Take 1 capsule (500 mg total) by mouth 3 (three) times daily. 06/01/14   Lyanne Co, MD   BP 105/50  Pulse 66  Temp(Src) 98.6 F (37 C) (Oral)  Resp 14  Ht  (1.626 m)  Wt 187 lb (84.823 kg)  BMI 32.08 kg/m2  SpO2 100%  LMP 05/04/2014 Physical Exam  Nursing note and vitals reviewed. Constitutional: She is oriented to person, place, and time. She appears well-developed and well-nourished. No distress.  HENT:  Head: Normocephalic and atraumatic.  Posterior pharynx is normal.  Eyes: EOM are normal.  Neck: Normal range of motion.  Cardiovascular: Normal rate, regular rhythm and normal heart sounds.   Pulmonary/Chest: Effort normal and breath sounds normal.  Abdominal: Soft. She exhibits no distension. There is no tenderness.  Musculoskeletal: Normal range of motion.  Neurological: She is alert and oriented to person, place, and time.  Skin: Skin is warm and dry.  Psychiatric: She has a  normal mood and affect. Judgment normal.    ED Course  Procedures (including critical care time) Labs Review Labs Reviewed  URINALYSIS, ROUTINE W REFLEX MICROSCOPIC - Abnormal; Notable for the following:    APPearance CLOUDY (*)    Hgb urine dipstick SMALL (*)    Leukocytes, UA LARGE (*)    All other components within normal limits  CBC WITH DIFFERENTIAL - Abnormal; Notable for the following:    RBC 5.12 (*)    MCV 77.1 (*)    MCH 25.8 (*)    All other components within normal limits  COMPREHENSIVE METABOLIC PANEL - Abnormal; Notable for the following:    Glucose, Bld 104 (*)    AST 41 (*)    ALT 44 (*)    Total Bilirubin 0.2 (*)    GFR  calc non Af Amer 66 (*)    GFR calc Af Amer 77 (*)    All other components within normal limits  URINE MICROSCOPIC-ADD ON - Abnormal; Notable for the following:    Bacteria, UA MANY (*)    All other components within normal limits  URINE CULTURE  POC URINE PREG, ED    Imaging Review Dg Chest 2 View  06/01/2014   CLINICAL DATA:  Productive cough, body aches  EXAM: CHEST  2 VIEW  COMPARISON:  Prior radiograph from 06/19/2013  FINDINGS: The cardiac and mediastinal silhouettes are stable in size and contour, and remain within normal limits.  The lungs are normally inflated. No airspace consolidation, pleural effusion, or pulmonary edema is identified. There is no pneumothorax.  No acute osseous abnormality identified.  IMPRESSION: No active cardiopulmonary disease.   Electronically Signed   By: Rise Mu M.D.   On: 06/01/2014 02:13     EKG Interpretation None      MDM   Final diagnoses:  Myalgia  Volume depletion  Urinary tract infection without hematuria, site unspecified    Overall well-appearing.  Feels better after pain medicine and IV fluids.  Likely volume depletion.  UTI.  Chest clear.  Posterior pharynx is normal.  Discharge home in good condition    Lyanne Co, MD 06/01/14 [phone]

## 2014-06-01 NOTE — ED Notes (Signed)
Pt arrives with cold worsening over the last two weeks, states she has body aches, cough, sinus pressure, sneezing. Has been taking Museux with no relief. Frequent urination, irritating with urination. No vaginal discharge.

## 2014-06-03 LAB — URINE CULTURE: Colony Count: >=100000

## 2014-06-04 ENCOUNTER — Telehealth (HOSPITAL_BASED_OUTPATIENT_CLINIC_OR_DEPARTMENT_OTHER): Payer: Self-pay | Admitting: Emergency Medicine

## 2014-06-04 NOTE — Telephone Encounter (Signed)
Post ED Visit - Positive Culture Follow-up  Culture report reviewed by antimicrobial stewardship pharmacist:  Wes Dulaney, Pharm.D., BCPS  Celedonio Miyamoto, Pharm.D., BCPS  Georgina Pillion, Pharm.D., BCPS  [place], [place].D., BCPS, AAHIVP  Estella Husk, Pharm.D., BCPS, AAHIVP  Red Christians, Pharm.D.  Tennis Must, Pharm.D.  Positive Urine culture Treated with Cephalexin, organism sensitive to the same and no further patient follow-up is required at this time.  Jiles Harold 06/04/2014, 5:15 PM

## 2016-02-17 ENCOUNTER — Encounter (HOSPITAL_COMMUNITY): Payer: Self-pay | Admitting: Emergency Medicine

## 2016-02-17 DIAGNOSIS — Z880 Allergy status to penicillin: Secondary | ICD-10-CM | POA: Insufficient documentation

## 2016-02-17 DIAGNOSIS — Z79899 Other long term (current) drug therapy: Secondary | ICD-10-CM | POA: Insufficient documentation

## 2016-02-17 DIAGNOSIS — R109 Unspecified abdominal pain: Secondary | ICD-10-CM | POA: Insufficient documentation

## 2016-02-17 DIAGNOSIS — Z8639 Personal history of other endocrine, nutritional and metabolic disease: Secondary | ICD-10-CM | POA: Diagnosis not present

## 2016-02-17 DIAGNOSIS — M545 Low back pain: Secondary | ICD-10-CM | POA: Diagnosis present

## 2016-02-17 NOTE — ED Notes (Signed)
Pt. reports right low back pain onset last week worse today , denies injury or fall , seen at an urgent care today prescribed with Flexeril with no relief. Pain increases with movement . Denies urinary discomfort.

## 2016-02-18 ENCOUNTER — Emergency Department (HOSPITAL_COMMUNITY)
Admission: EM | Admit: 2016-02-18 | Discharge: 2016-02-18 | Disposition: A | Discharge status: Home / Self Care | Payer: PRIVATE HEALTH INSURANCE | Attending: Emergency Medicine | Admitting: Emergency Medicine

## 2016-02-18 DIAGNOSIS — R109 Unspecified abdominal pain: Secondary | ICD-10-CM

## 2016-02-18 LAB — COMPREHENSIVE METABOLIC PANEL
ALK PHOS: 59 U/L (ref 38–126)
ALT: 15 U/L (ref 14–54)
ANION GAP: 10 (ref 5–15)
AST: 28 U/L (ref 15–41)
Albumin: 3.8 g/dL (ref 3.5–5.0)
BILIRUBIN TOTAL: 0.7 mg/dL (ref 0.3–1.2)
BUN: 16 mg/dL (ref 6–20)
CALCIUM: 9.2 mg/dL (ref 8.9–10.3)
CO2: 25 mmol/L (ref 22–32)
Chloride: 105 mmol/L (ref 101–111)
Creatinine, Ser: 0.93 mg/dL (ref 0.44–1.00)
Glucose, Bld: 104 mg/dL — ABNORMAL HIGH (ref 65–99)
Potassium: 5 mmol/L (ref 3.5–5.1)
Sodium: 140 mmol/L (ref 135–145)
Total Protein: 6.1 g/dL — ABNORMAL LOW (ref 6.5–8.1)

## 2016-02-18 LAB — CBC WITH DIFFERENTIAL/PLATELET
Basophils Absolute: 0.1 10*3/uL (ref 0.0–0.1)
Basophils Relative: 1 %
Eosinophils Absolute: 0.3 10*3/uL (ref 0.0–0.7)
Eosinophils Relative: 3 %
HEMATOCRIT: 37.8 % (ref 36.0–46.0)
HEMOGLOBIN: 12.3 g/dL (ref 12.0–15.0)
LYMPHS ABS: 2.6 10*3/uL (ref 0.7–4.0)
Lymphocytes Relative: 30 %
MCH: 24.9 pg — AB (ref 26.0–34.0)
MCHC: 32.5 g/dL (ref 30.0–36.0)
MCV: 76.7 fL — AB (ref 78.0–100.0)
MONOS PCT: 9 %
Monocytes Absolute: 0.8 10*3/uL (ref 0.1–1.0)
NEUTROS PCT: 58 %
Neutro Abs: 5 10*3/uL (ref 1.7–7.7)
Platelets: 194 10*3/uL (ref 150–400)
RBC: 4.93 MIL/uL (ref 3.87–5.11)
RDW: 15 % (ref 11.5–15.5)
WBC: 8.8 10*3/uL (ref 4.0–10.5)

## 2016-02-18 LAB — URINE MICROSCOPIC-ADD ON

## 2016-02-18 LAB — URINALYSIS, ROUTINE W REFLEX MICROSCOPIC
Bilirubin Urine: NEGATIVE
Glucose, UA: NEGATIVE mg/dL
Ketones, ur: NEGATIVE mg/dL
LEUKOCYTES UA: NEGATIVE
NITRITE: NEGATIVE
Protein, ur: NEGATIVE mg/dL
Specific Gravity, Urine: 1.017 (ref 1.005–1.030)
pH: 6.5 (ref 5.0–8.0)

## 2016-02-18 MED ORDER — OXYCODONE-ACETAMINOPHEN 5-325 MG PO TABS: 1.0000 | ORAL_TABLET | Freq: Four times a day (QID) | ORAL | Status: AC | PRN

## 2016-02-18 MED ORDER — SODIUM CHLORIDE 0.9 % IV BOLUS (SEPSIS)
1000.0000 mL | Freq: Once | INTRAVENOUS | Status: AC
  Administered 2016-02-18: 1000 mL via INTRAVENOUS

## 2016-02-18 MED ORDER — MORPHINE SULFATE (PF) 4 MG/ML IV SOLN
4.0000 mg | Freq: Once | INTRAVENOUS | Status: AC
  Administered 2016-02-18: 4 mg via INTRAVENOUS
  Filled 2016-02-18: qty 1

## 2016-02-18 MED ORDER — ONDANSETRON HCL 4 MG/2ML IJ SOLN
4.0000 mg | Freq: Once | INTRAMUSCULAR | Status: AC
  Administered 2016-02-18: 4 mg via INTRAVENOUS
  Filled 2016-02-18: qty 2

## 2016-02-18 MED ORDER — OXYCODONE-ACETAMINOPHEN 5-325 MG PO TABS
1.0000 | ORAL_TABLET | Freq: Once | ORAL | Status: AC
  Administered 2016-02-18: 1 via ORAL
  Filled 2016-02-18: qty 1

## 2016-02-18 NOTE — ED Provider Notes (Signed)
CSN: [id number]     Arrival date & time 02/17/16  2317 History  By signing my name below, I, Tiffany Moyer, attest that this documentation has been prepared under the direction and in the presence of Tiffany Baton, MD. Electronically Signed: Phillis Moyer, ED Scribe. 02/18/2016. 1:39 AM.  Chief Complaint  Patient presents with  . Back Pain   The history is provided by the patient. No language interpreter was used.  HPI Comments: Tiffany Moyer is a 50 y.o. female who presents to the Emergency Department complaining of gradually worsening, intermittent, sharp right lower back pain onset one week ago, worsening today. She rates her pain 10/10. Pt reports that she was seen at [facility] for this problem and was prescribed Flexeril to no relief. Pt states that she has had diarrhea earlier today and noticed hematuria when at Urgent Care today. Pt additionally took an Aleve to mild relief. She states that she had a CT scan and x-ray done this morning at Urgent Care, but did not receive the results. Pt denies worsening or alleviating factors. She denies hx of kidney stones, injury, fall, or dysuria.    Past Medical History  Diagnosis Date  . Goiter    Past Surgical History  Procedure Laterality Date  . Ablation on endometriosis     No family history on file. Social History  Substance Use Topics  . Smoking status: Never Smoker   . Smokeless tobacco: None  . Alcohol Use: No   OB History    No data available     Review of Systems  Constitutional: Negative for fever.  Gastrointestinal: Positive for diarrhea. Negative for vomiting.  Genitourinary: Positive for hematuria and flank pain. Negative for dysuria.  Musculoskeletal: Positive for back pain.  Skin: Negative for rash.  All other systems reviewed and are negative.  Allergies  Amoxicillin  Home Medications   Prior to Admission medications   Medication Sig Start Date End Date Taking? Authorizing  Provider  cetirizine (ZYRTEC) 10 MG tablet Take 10 mg by mouth daily.   Yes Historical Provider, MD  cyclobenzaprine (FLEXERIL) 5 MG tablet Take 5 mg by mouth 3 (three) times daily as needed for muscle spasms.   Yes Historical Provider, MD  diphenhydrAMINE (BENADRYL) 25 mg capsule Take 25 mg by mouth every 6 (six) hours as needed for allergies.   Yes Historical Provider, MD  fluticasone (FLONASE) 50 MCG/ACT nasal spray Place 1 spray into both nostrils daily as needed for allergies or rhinitis.   Yes Historical Provider, MD  ibuprofen (ADVIL,MOTRIN) 200 MG tablet Take 800 mg by mouth every 6 (six) hours as needed for moderate pain.   Yes Historical Provider, MD  naproxen sodium (ANAPROX) 220 MG tablet Take 220 mg by mouth daily as needed (pain).   Yes Historical Provider, MD  oxyCODONE-acetaminophen (PERCOCET/ROXICET) 5-325 MG tablet Take 1-2 tablets by mouth every 6 (six) hours as needed for severe pain. 02/18/16   Tiffany Baton, MD   BP 120/74 mmHg  Pulse 66  Temp(Src) 97.5 F (36.4 C) (Oral)  Resp 15  Ht  (1.626 m)  Wt 199 lb 4 oz (90.379 kg)  BMI 34.18 kg/m2  SpO2 97% Physical Exam  Constitutional: She is oriented to person, place, and time. She appears well-developed and well-nourished. No distress.  HENT:  Head: Normocephalic and atraumatic.  Cardiovascular: Normal rate, regular rhythm and normal heart sounds.   No murmur heard. Pulmonary/Chest: Effort normal and breath sounds normal. No respiratory  distress. She has no wheezes.  Abdominal: Soft. Bowel sounds are normal. There is tenderness. There is no rebound and no guarding.  Tenderness palpation right lower flank extending into the right back, no rebound or guarding, no flank right lower quadrant tenderness, no CVA tenderness  Neurological: She is alert and oriented to person, place, and time.  Skin: Skin is warm and dry.  Psychiatric: She has a normal mood and affect.  Nursing note and vitals reviewed.   ED Course   Procedures (including critical care time) DIAGNOSTIC STUDIES: Oxygen Saturation is 98% on RA, normal by my interpretation.    COORDINATION OF CARE: 1:37 AM-Discussed treatment plan which includes labs with pt at bedside and pt agreed to plan.   2:33 AM- radiology able to see [facility]. The CT scan showed a normal gallbladder and appendix specifically.  Labs Review Labs Reviewed  CBC WITH DIFFERENTIAL/PLATELET - Abnormal; Notable for the following:    MCV 76.7 (*)    MCH 24.9 (*)    All other components within normal limits  COMPREHENSIVE METABOLIC PANEL - Abnormal; Notable for the following:    Glucose, Bld 104 (*)    Total Protein 6.1 (*)    All other components within normal limits  URINALYSIS, ROUTINE W REFLEX MICROSCOPIC (NOT AT [facility]) - Abnormal; Notable for the following:    Hgb urine dipstick TRACE (*)    All other components within normal limits  URINE MICROSCOPIC-ADD ON - Abnormal; Notable for the following:    Squamous Epithelial / LPF 0-5 (*)    Bacteria, UA RARE (*)    All other components within normal limits    Imaging Review No results found. I have personally reviewed and evaluated these images and lab results as part of my medical decision-making.   EKG Interpretation None      MDM   Final diagnoses:  Flank pain    Patient presents with right back and right flank pain. Ongoing for the last week. Reports worked up at urgent care today and prescribed Flexeril without relief. She is tender over the right lower back and right flank. She is otherwise nontoxic. Basic labwork obtained and reassuring. No leukocytosis. Trace hemoglobin in the urine. No evidence of urinary tract infection. I called the radiologist. Her images from urgent care were verbally read to me over the phone. She had a CT scan noncontrasted of the abdomen and pelvis which were negative including no obvious gallbladder pathology or appendix  inflammation. No noted kidney stones. Patient reports continued pain in the ER. She remains nontoxic and ambulatory without difficulty. Called her pain is unknown at this time. Musculoskeletal pain or shingles prodrome is a consideration. Will discharge home with oxycodone. Follow-up with primary physician within 24 hours for recheck.  After history, exam, and medical workup I feel the patient has been appropriately medically screened and is safe for discharge home. Pertinent diagnoses were discussed with the patient. Patient was given return precautions.  I personally performed the services described in this documentation, which was scribed in my presence. The recorded information has been reviewed and is accurate.    Tiffany Batonourtney F Horton, MD 02/18/16 [phone]

## 2016-02-18 NOTE — ED Notes (Signed)
Pt called out stating that she needs to go, EDP aware and at bedside

## 2016-02-18 NOTE — Discharge Instructions (Signed)
You were seen today for flank pain. Your workup is reassuring. The CT scan that you had done earlier at your urgent care was read as normal. No evidence of kidney stone. The cause of her pain is unclear at this time. It could be musculoskeletal. If you develop rash she needs to be reevaluated for shingles. Follow-up with your primary care physician.  Flank Pain Flank pain refers to pain that is located on the side of the body between the upper abdomen and the back. The pain may occur over a short period of time (acute) or may be long-term or reoccurring (chronic). It may be mild or severe. Flank pain can be caused by many things. CAUSES  Some of the more common causes of flank pain include:  Muscle strains.   Muscle spasms.   A disease of your spine (vertebral disk disease).   A lung infection (pneumonia).   Fluid around your lungs (pulmonary edema).   A kidney infection.   Kidney stones.   A very painful skin rash caused by the chickenpox virus (shingles).   Gallbladder disease.  HOME CARE INSTRUCTIONS  Home care will depend on the cause of your pain. In general,  Rest as directed by your caregiver.  Drink enough fluids to keep your urine clear or pale yellow.  Only take over-the-counter or prescription medicines as directed by your caregiver. Some medicines may help relieve the pain.  Tell your caregiver about any changes in your pain.  Follow up with your caregiver as directed. SEEK IMMEDIATE MEDICAL CARE IF:   Your pain is not controlled with medicine.   You have new or worsening symptoms.  Your pain increases.   You have abdominal pain.   You have shortness of breath.   You have persistent nausea or vomiting.   You have swelling in your abdomen.   You feel faint or pass out.   You have blood in your urine.  You have a fever or persistent symptoms for more than 2-3 days.  You have a fever and your symptoms suddenly get worse. MAKE SURE  YOU:   Understand these instructions.  Will watch your condition.  Will get help right away if you are not doing well or get worse.   This information is not intended to replace advice given to you by your health care provider. Make sure you discuss any questions you have with your health care provider.   Document Released: 11/16/2005 Document Revised: 06/19/2012 Document Reviewed: 05/09/2012 Elsevier Interactive Patient Education [employer]2016 Elsevier Inc.

## 2016-02-18 NOTE — ED Notes (Signed)
EDP at bedside  

## 2016-02-18 NOTE — ED Notes (Signed)
Pt ambulatory to restroom in NAD.

## 2016-07-15 IMAGING — CR DG CHEST 2V
2 series · 2 of 2 positions shown · non-contrast
Comparison: Prior radiograph from 06/19/2013

CLINICAL DATA: Productive cough, body aches

EXAM:
CHEST  2 VIEW

[w chest pa]
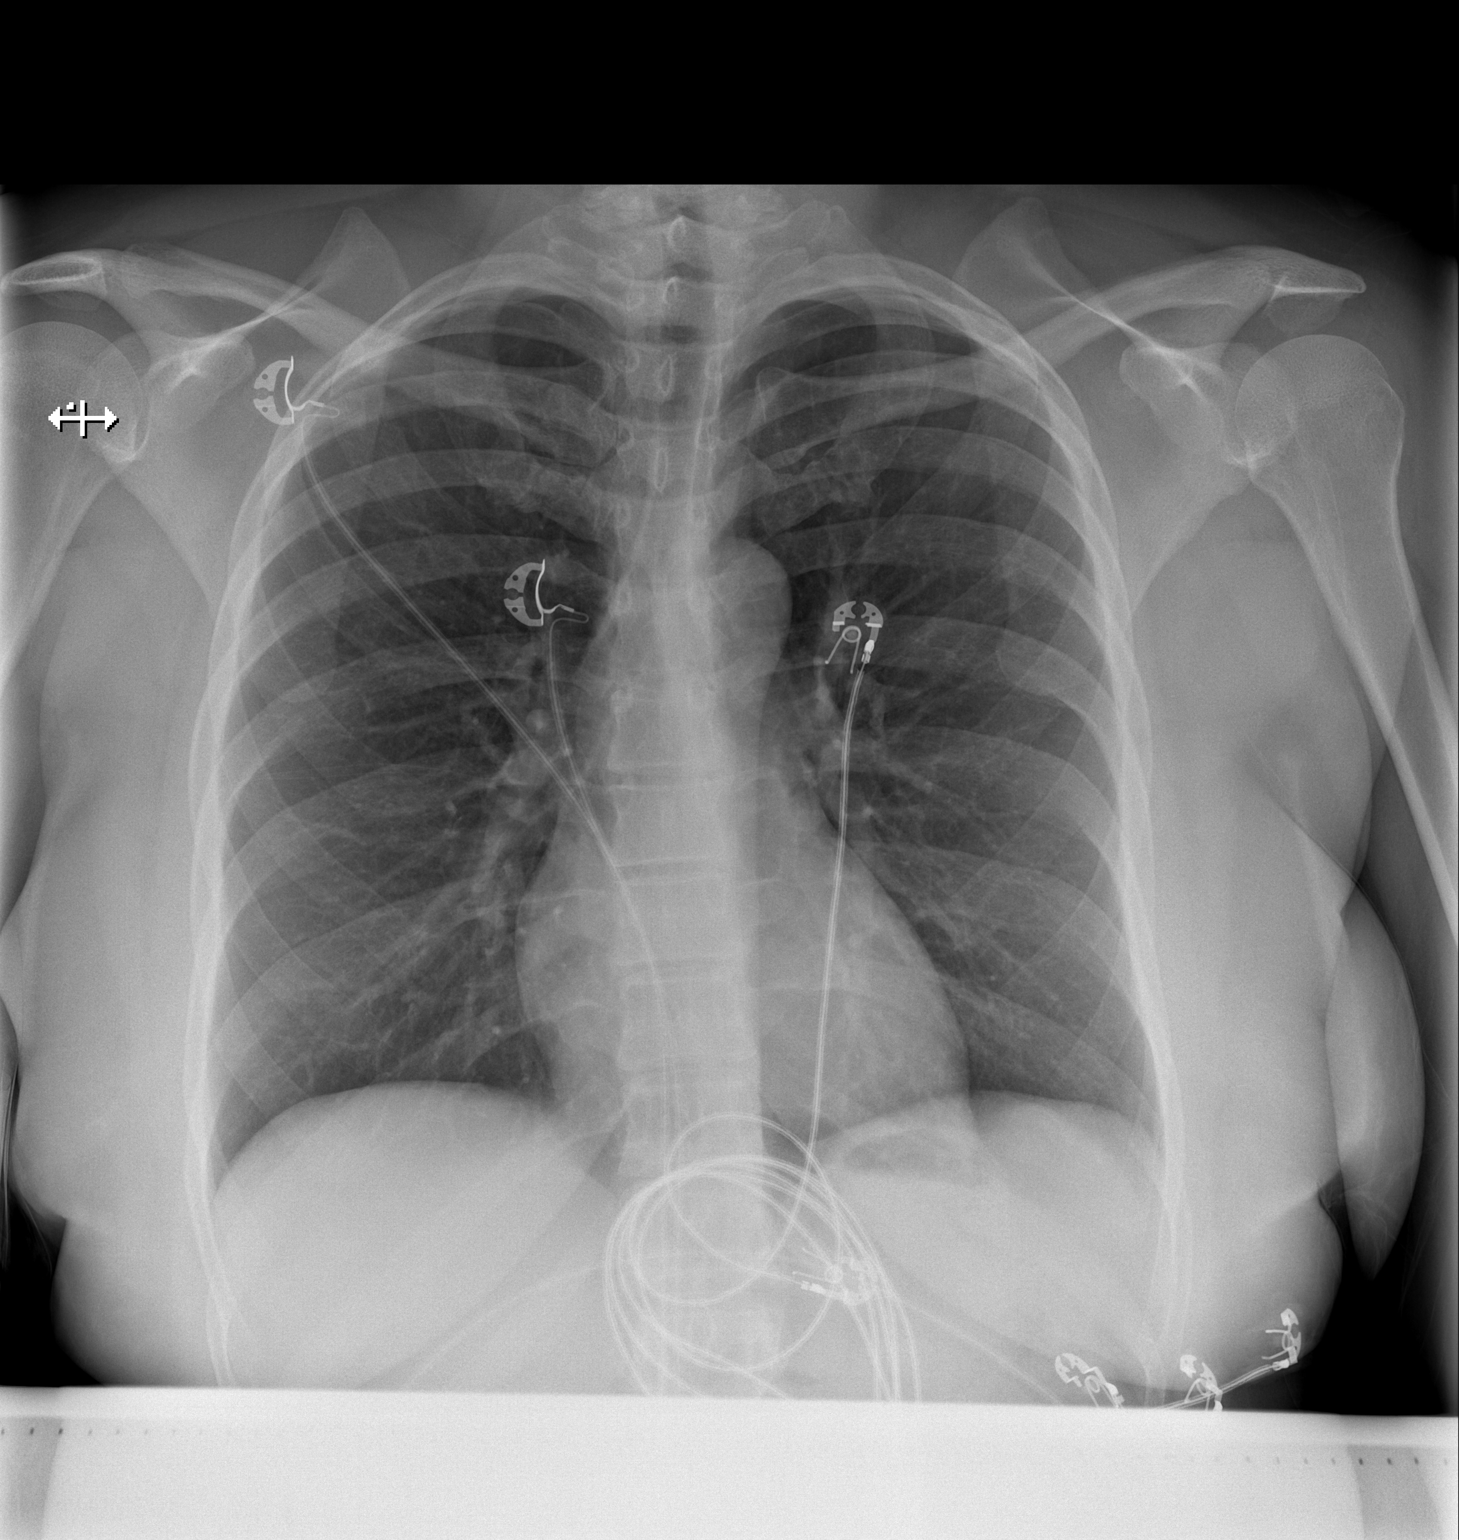

[w chest lat]
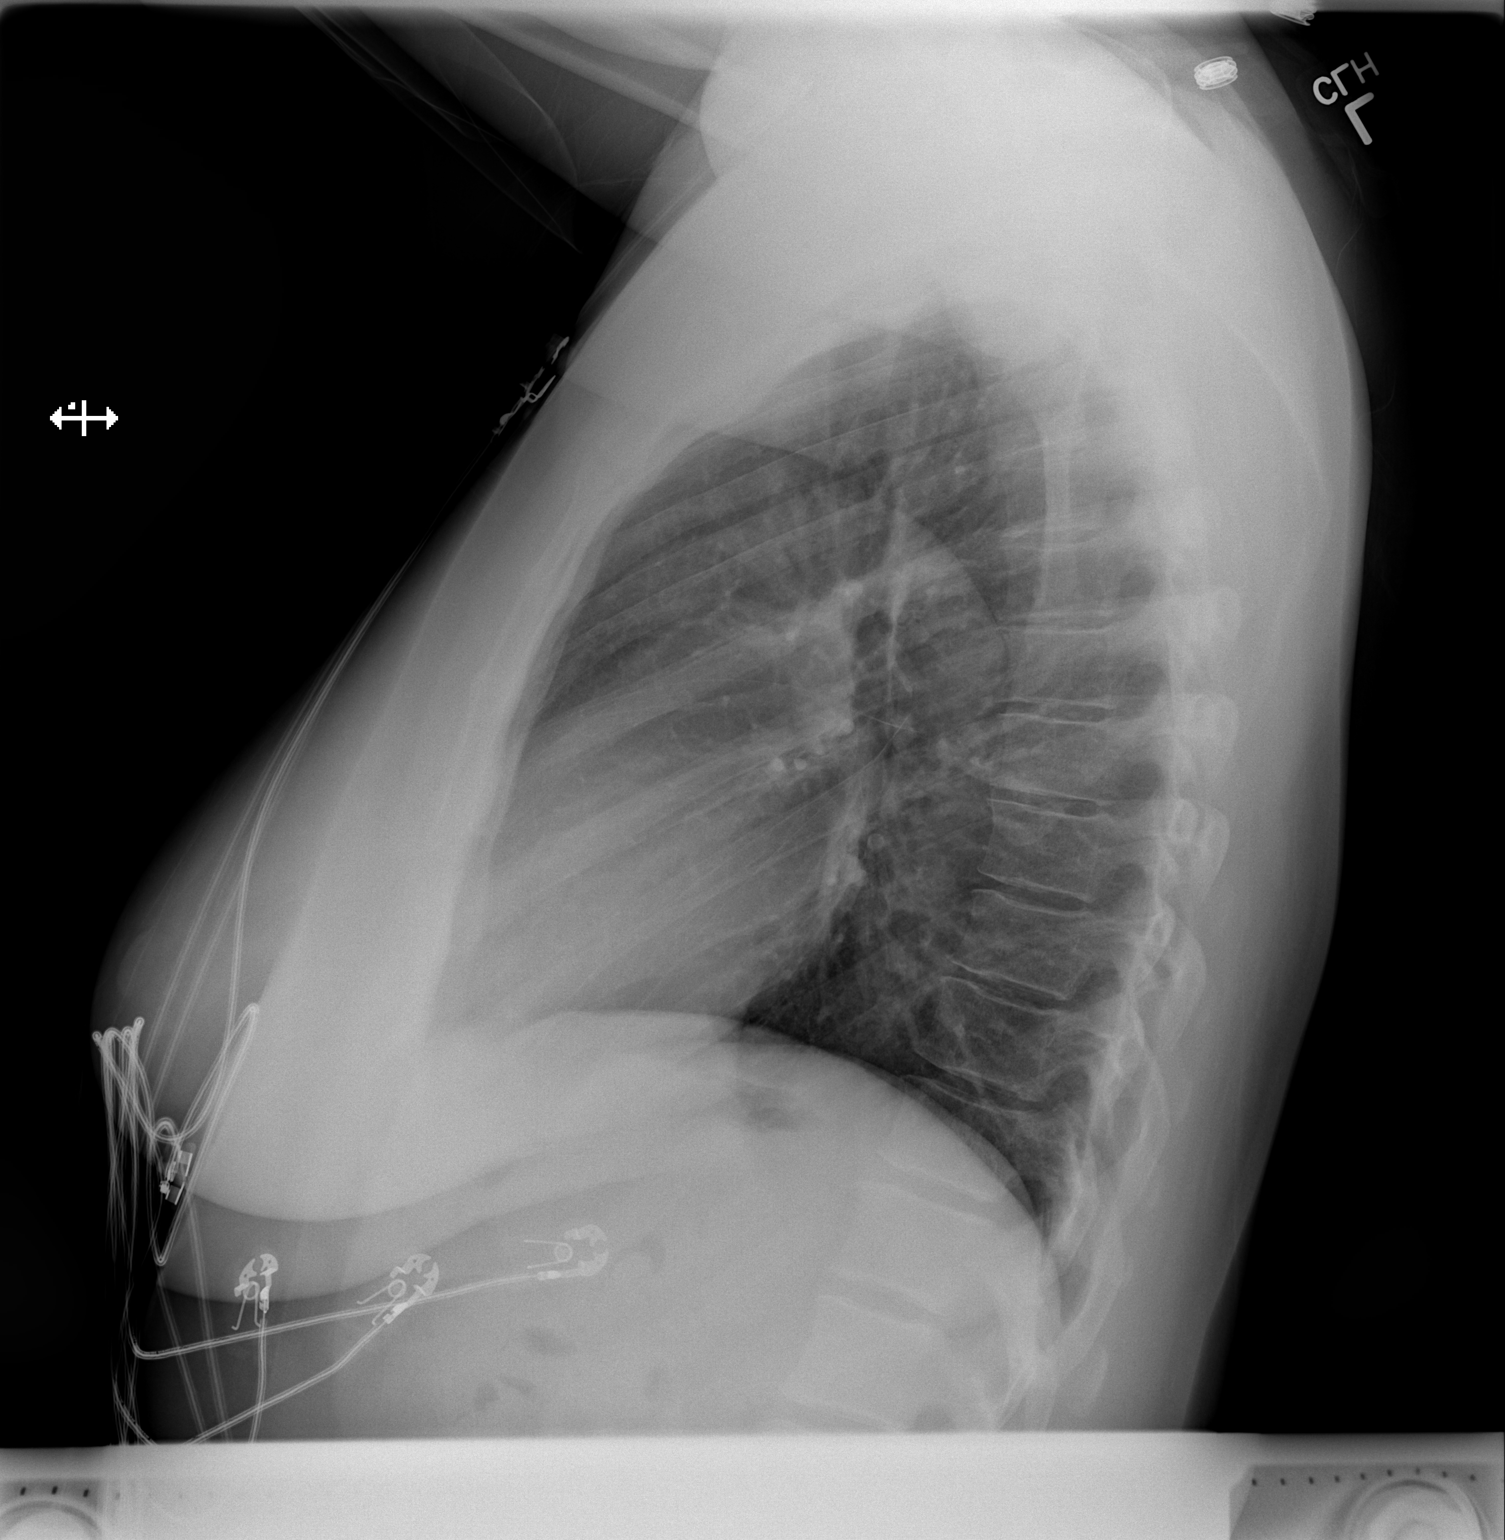

[2 of 2 positions shown; findings below may reference images not displayed]

FINDINGS: The cardiac and mediastinal silhouettes are stable in size and
contour, and remain within normal limits.

The lungs are normally inflated. No airspace consolidation, pleural
effusion, or pulmonary edema is identified. There is no
pneumothorax.

No acute osseous abnormality identified.
IMPRESSION: No active cardiopulmonary disease.

## 2020-02-21 ENCOUNTER — Emergency Department (HOSPITAL_BASED_OUTPATIENT_CLINIC_OR_DEPARTMENT_OTHER)
Admission: EM | Admit: 2020-02-21 | Discharge: 2020-02-21 | Disposition: A | Discharge status: Home / Self Care | Payer: PRIVATE HEALTH INSURANCE | Attending: Emergency Medicine | Admitting: Emergency Medicine

## 2020-02-21 ENCOUNTER — Other Ambulatory Visit: Payer: Self-pay

## 2020-02-21 ENCOUNTER — Encounter (HOSPITAL_BASED_OUTPATIENT_CLINIC_OR_DEPARTMENT_OTHER): Payer: Self-pay | Admitting: Emergency Medicine

## 2020-02-21 ENCOUNTER — Emergency Department (HOSPITAL_BASED_OUTPATIENT_CLINIC_OR_DEPARTMENT_OTHER): Payer: PRIVATE HEALTH INSURANCE

## 2020-02-21 DIAGNOSIS — N939 Abnormal uterine and vaginal bleeding, unspecified: Secondary | ICD-10-CM | POA: Diagnosis present

## 2020-02-21 DIAGNOSIS — N938 Other specified abnormal uterine and vaginal bleeding: Secondary | ICD-10-CM

## 2020-02-21 DIAGNOSIS — Z880 Allergy status to penicillin: Secondary | ICD-10-CM | POA: Diagnosis not present

## 2020-02-21 DIAGNOSIS — Z79899 Other long term (current) drug therapy: Secondary | ICD-10-CM | POA: Insufficient documentation

## 2020-02-21 LAB — URINALYSIS, ROUTINE W REFLEX MICROSCOPIC
Bilirubin Urine: NEGATIVE
Glucose, UA: NEGATIVE mg/dL
Ketones, ur: NEGATIVE mg/dL
Nitrite: NEGATIVE
Protein, ur: 30 mg/dL — AB
Specific Gravity, Urine: 1.025 (ref 1.005–1.030)
pH: 6.5 (ref 5.0–8.0)

## 2020-02-21 LAB — CBC WITH DIFFERENTIAL/PLATELET
Abs Immature Granulocytes: 0.03 10*3/uL (ref 0.00–0.07)
Basophils Absolute: 0.1 10*3/uL (ref 0.0–0.1)
Basophils Relative: 1 %
Eosinophils Absolute: 0.1 10*3/uL (ref 0.0–0.5)
Eosinophils Relative: 2 %
HCT: 41 % (ref 36.0–46.0)
Hemoglobin: 12.9 g/dL (ref 12.0–15.0)
Immature Granulocytes: 0 %
Lymphocytes Relative: 34 %
Lymphs Abs: 2.7 10*3/uL (ref 0.7–4.0)
MCH: 26 pg (ref 26.0–34.0)
MCHC: 31.5 g/dL (ref 30.0–36.0)
MCV: 82.7 fL (ref 80.0–100.0)
Monocytes Absolute: 0.5 10*3/uL (ref 0.1–1.0)
Monocytes Relative: 6 %
Neutro Abs: 4.5 10*3/uL (ref 1.7–7.7)
Neutrophils Relative %: 57 %
Platelets: 280 10*3/uL (ref 150–400)
RBC: 4.96 MIL/uL (ref 3.87–5.11)
RDW: 14.7 % (ref 11.5–15.5)
WBC: 7.9 10*3/uL (ref 4.0–10.5)
nRBC: 0 % (ref 0.0–0.2)

## 2020-02-21 LAB — URINALYSIS, MICROSCOPIC (REFLEX): RBC / HPF: >50 RBC/hpf (ref 0–5)

## 2020-02-21 LAB — PREGNANCY, URINE: Preg Test, Ur: NEGATIVE

## 2020-02-21 LAB — WET PREP, GENITAL
Clue Cells Wet Prep HPF POC: NONE SEEN
Sperm: NONE SEEN
Trich, Wet Prep: NONE SEEN
Yeast Wet Prep HPF POC: NONE SEEN

## 2020-02-21 LAB — BASIC METABOLIC PANEL
Anion gap: 10 (ref 5–15)
BUN: 17 mg/dL (ref 6–20)
CO2: 26 mmol/L (ref 22–32)
Calcium: 9.2 mg/dL (ref 8.9–10.3)
Chloride: 103 mmol/L (ref 98–111)
Creatinine, Ser: 0.9 mg/dL (ref 0.44–1.00)
GFR calc Af Amer: >60 mL/min (ref >60)
GFR calc non Af Amer: >60 mL/min (ref >60)
Glucose, Bld: 105 mg/dL — ABNORMAL HIGH (ref 70–99)
Potassium: 4.3 mmol/L (ref 3.5–5.1)
Sodium: 139 mmol/L (ref 135–145)

## 2020-02-21 MED ORDER — MEDROXYPROGESTERONE ACETATE 5 MG PO TABS: 10.0000 mg | ORAL_TABLET | Freq: Every day | ORAL | 0 refills | Status: DC

## 2020-02-21 MED ORDER — MEDROXYPROGESTERONE ACETATE 5 MG PO TABS: 10.0000 mg | ORAL_TABLET | Freq: Every day | ORAL | 0 refills | Status: AC

## 2020-02-21 NOTE — ED Triage Notes (Addendum)
Pt here with cramping and lower abdominal discomfort x 1 week. Bleeding started 2 days ago and has progressively gotten worse. Went to UC and was given macrobid, but sx seem to be getting more severe since starting the abx. Foul odor is present. Pt is in menopause and has had same sexual partner for 25 years.

## 2020-02-21 NOTE — ED Notes (Signed)
Pt went to use the restroom after vitals.

## 2020-02-21 NOTE — Discharge Instructions (Addendum)
Begin taking Provera as prescribed.  Follow-up with your GYN on Monday, and return to the ER if you develop significantly worsening bleeding, fevers, worsening pain, or other new and concerning symptoms.

## 2020-02-21 NOTE — ED Provider Notes (Signed)
[facility] EMERGENCY DEPARTMENT Provider Note   CSN: [id number] Arrival date & time: 02/21/20  0746     History Chief Complaint  Patient presents with  . Vaginal Bleeding    Tiffany Moyer is a 54 y.o. female.  Patient is a 54 year old female with history of endometriosis.  She presents with a 1 week history of lower abdominal cramping and discomfort.  She started with spotting 2 days ago and now with light vaginal bleeding.  She denies any fevers, chills, or discharge.  Patient is sexually active with the same partner whom she has been with for the past 25 years.  She has not had a menstrual period in the last 2 years.  She has had a tubal ligation in the past.  The history is provided by the patient.  Vaginal Bleeding Quality:  Spotting Severity:  Mild Onset quality:  Sudden Duration:  2 days Timing:  Intermittent Progression:  Worsening Chronicity:  New Possible pregnancy: no   Relieved by:  Nothing Worsened by:  Nothing Ineffective treatments:  None tried      Past Medical History:  Diagnosis Date  . Goiter     There are no problems to display for this patient.   Past Surgical History:  Procedure Laterality Date  . ABLATION ON ENDOMETRIOSIS       OB History   No obstetric history on file.     History reviewed. No pertinent family history.  Social History   Tobacco Use  . Smoking status: Never Smoker  Substance Use Topics  . Alcohol use: No  . Drug use: No    Home Medications Prior to Admission medications   Medication Sig Start Date End Date Taking? Authorizing Provider  cetirizine (ZYRTEC) 10 MG tablet Take 10 mg by mouth daily.    [provider]  cyclobenzaprine (FLEXERIL) 5 MG tablet Take 5 mg by mouth 3 (three) times daily as needed for muscle spasms.    [provider]  diphenhydrAMINE (BENADRYL) 25 mg capsule Take 25 mg by mouth every 6 (six) hours as needed for allergies.    [provider]    fluticasone (FLONASE) 50 MCG/ACT nasal spray Place 1 spray into both nostrils daily as needed for allergies or rhinitis.    [provider]  ibuprofen (ADVIL,MOTRIN) 200 MG tablet Take 800 mg by mouth every 6 (six) hours as needed for moderate pain.    [provider]  naproxen sodium (ANAPROX) 220 MG tablet Take 220 mg by mouth daily as needed (pain).    [provider]  oxyCODONE-acetaminophen (PERCOCET/ROXICET) 5-325 MG tablet Take 1-2 tablets by mouth every 6 (six) hours as needed for severe pain. 02/18/16   Horton, Mayer Masker, MD    Allergies    Amoxicillin  Review of Systems   Review of Systems  Genitourinary: Positive for vaginal bleeding.  All other systems reviewed and are negative.   Physical Exam Updated Vital Signs BP 133/74 (BP Location: Right Arm)   Pulse 88   Temp 98.3 F (36.8 C) (Oral)   Resp 16   Ht 5\' 4"  (1.626 m)   Wt 92.5 kg   LMP 05/04/2014   SpO2 98%   BMI 35.02 kg/m   Physical Exam Vitals and nursing note reviewed.  Constitutional:      General: She is not in acute distress.    Appearance: She is well-developed. She is not diaphoretic.  HENT:     Head: Normocephalic and atraumatic.  Cardiovascular:  Rate and Rhythm: Normal rate and regular rhythm.     Heart sounds: No murmur. No friction rub. No gallop.   Pulmonary:     Effort: Pulmonary effort is normal. No respiratory distress.     Breath sounds: Normal breath sounds. No wheezing.  Abdominal:     General: Bowel sounds are normal. There is no distension.     Palpations: Abdomen is soft.     Tenderness: There is abdominal tenderness. There is no guarding or rebound.     Comments: There is mild tenderness in the suprapubic region.  Genitourinary:    Vagina: No vaginal discharge.     Comments: The external genitalia is unremarkable. There is maroon-colored blood in the vaginal vault and exuding from the cervical os. There are no obvious lesions visible. There are no  adnexal masses, no cervical motion tenderness. Musculoskeletal:        General: Normal range of motion.     Cervical back: Normal range of motion and neck supple.  Skin:    General: Skin is warm and dry.  Neurological:     Mental Status: She is alert and oriented to person, place, and time.     ED Results / Procedures / Treatments   Labs (all labs ordered are listed, but only abnormal results are displayed) Labs Reviewed  WET PREP, GENITAL  URINALYSIS, ROUTINE W REFLEX MICROSCOPIC  PREGNANCY, URINE  BASIC METABOLIC PANEL  CBC WITH DIFFERENTIAL/PLATELET  GC/CHLAMYDIA PROBE AMP ([place]) NOT AT [facility]    EKG None  Radiology No results found.  Procedures Procedures (including critical care time)  Medications Ordered in ED Medications - No data to display  ED Course  I have reviewed the triage vital signs and the nursing notes.  Pertinent labs & imaging results that were available during my care of the patient were reviewed by me and considered in my medical decision making (see chart for details).    MDM Rules/Calculators/A&P  Patient presenting with complaints of pelvic discomfort and vaginal bleeding for the past 2 days.  Her pelvic examination reveals dark blood in the vaginal vault, however no other obvious abnormalities.  Wet prep is unremarkable and blood counts are stable.  She is afebrile with no white count.  Ultrasound shows thickness of the endometrium at the upper limits of normal and what appears to be a fibroid.  There is also a cystic structure noted in the left adnexa with nodular components, the significance of which is uncertain.  Patient will have this followed up by her GYN later this week.  She will be given Provera for the bleeding and is advised to call her GYN on Monday to arrange an appointment.  Her hemoglobin is stable and she is not actively bleeding.  She also has no fever or white count and I doubt an infectious process, but GC and Chlamydia  cultures are pending.  Final Clinical Impression(s) / ED Diagnoses Final diagnoses:  None    Rx / DC Orders ED Discharge Orders    None       Veryl Speak, MD 02/21/20 1109

## 2020-02-23 LAB — GC/CHLAMYDIA PROBE AMP (~~LOC~~) NOT AT ARMC
Chlamydia: NEGATIVE
Comment: NEGATIVE
Comment: NORMAL
Neisseria Gonorrhea: NEGATIVE

## 2020-03-30 ENCOUNTER — Other Ambulatory Visit: Payer: Self-pay | Admitting: Obstetrics and Gynecology

## 2022-02-03 ENCOUNTER — Telehealth: Payer: Self-pay | Admitting: *Deleted

## 2022-02-03 NOTE — Telephone Encounter (Signed)
Received call from Ms. Salatino this morning. Patient following up on a new patient appointment with Dr. Pricilla Holm. Offered patient appointment on Monday 02/13/22. Patient declined appointment and states she can get in earlier at [facility]. Advised patient that since she she declined the appointment, the time it will be offered to another patient. She verbalized understanding.  ?

## 2022-02-03 NOTE — Telephone Encounter (Signed)
Attempted to reach the patient to schedule a new patient appt with Dr Pricilla Holm on 5/8; LMOM to call the office back  ?

## 2022-04-06 IMAGING — US US PELVIS COMPLETE WITH TRANSVAGINAL
1 series · 13 of 25 positions shown · non-contrast
Comparison: None

CLINICAL DATA: Generalized pelvic cramping for 1 week with vaginal
bleeding

EXAM:
TRANSABDOMINAL AND TRANSVAGINAL ULTRASOUND OF PELVIS
TECHNIQUE: Both transabdominal and transvaginal ultrasound examinations of the
pelvis were performed. Transabdominal technique was performed for
global imaging of the pelvis including uterus, ovaries, adnexal
regions, and pelvic cul-de-sac. It was necessary to proceed with
endovaginal exam following the transabdominal exam to visualize the
endometrium and ovaries.

[Series 1: us pelvis complete with transvaginal · 65 acquisitions, 13 frames shown]
[im 1/65]
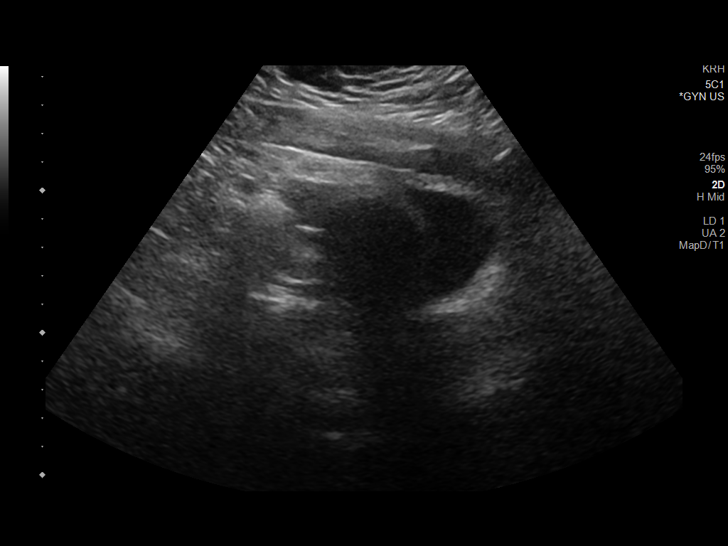
[im 6/65]
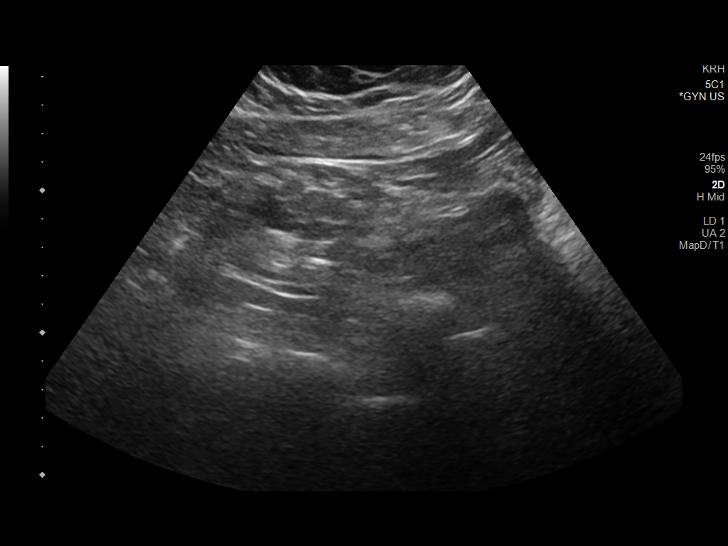
[im 11/65]
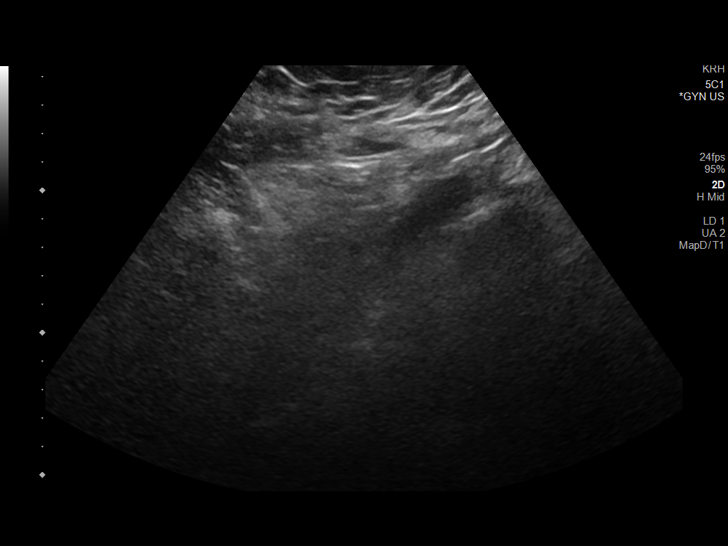
[im 17/65]
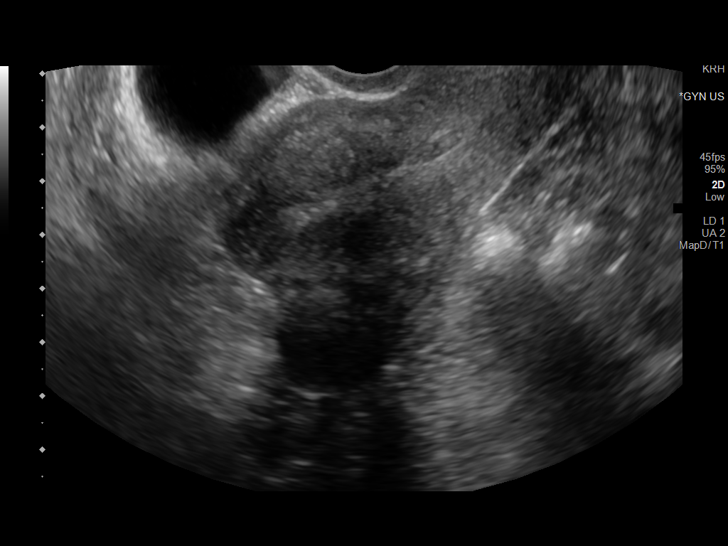
[im 22/65]
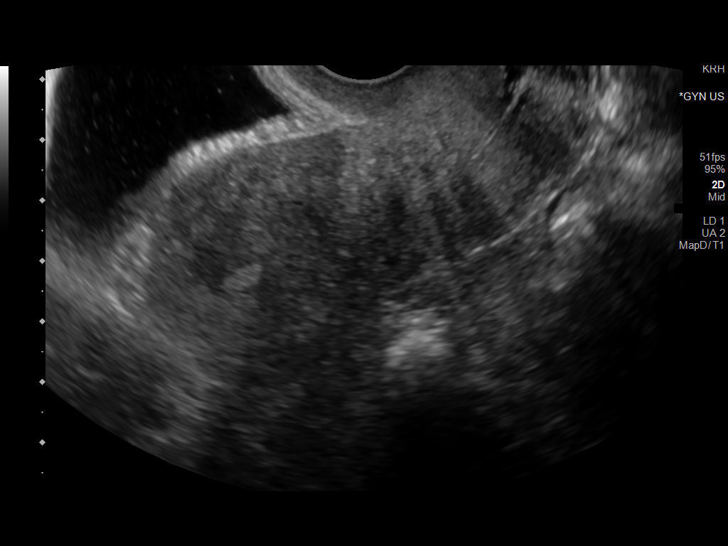
[im 27/65]
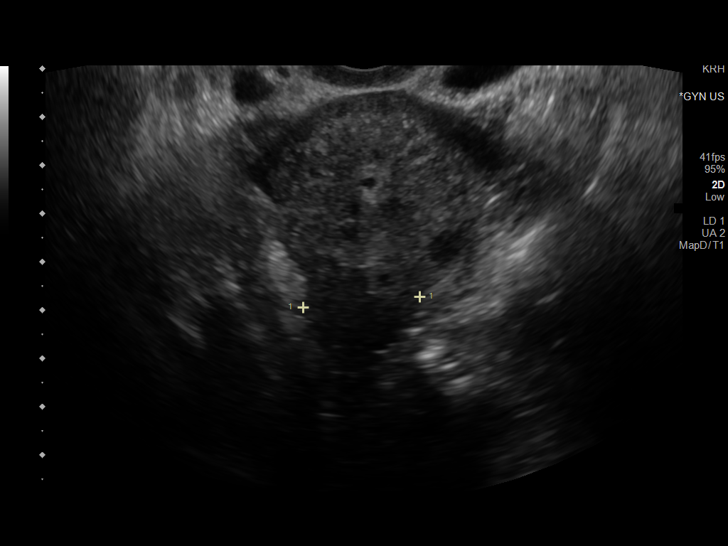
[im 33/65]
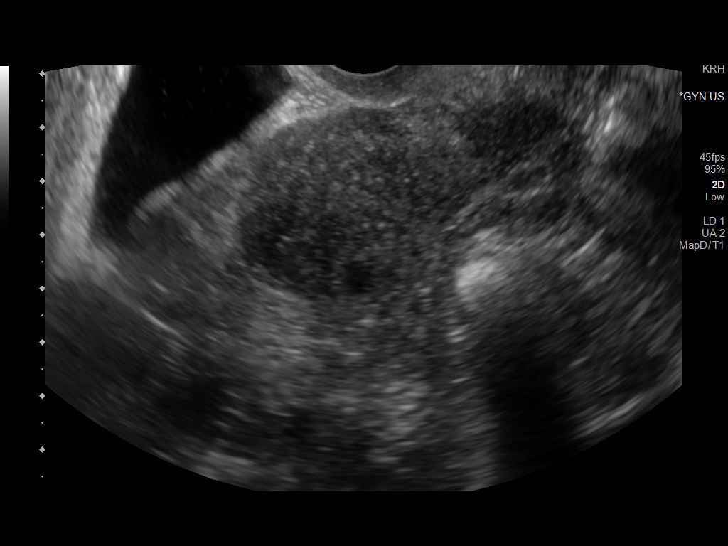
[im 38/65]
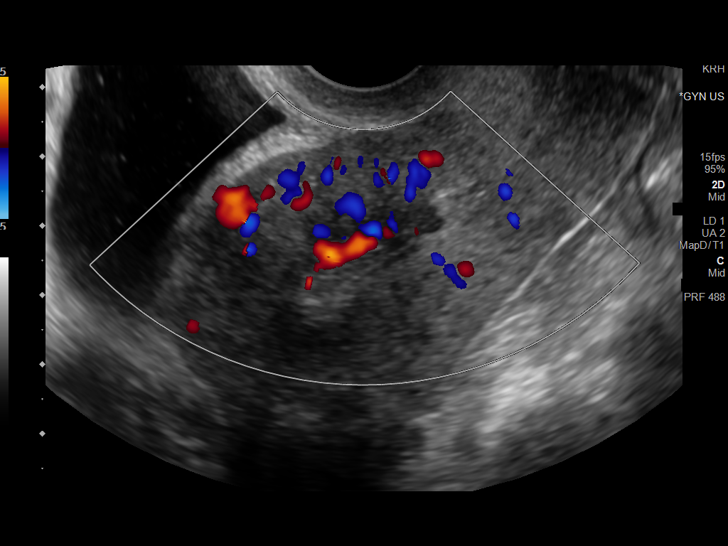
[im 43/65]
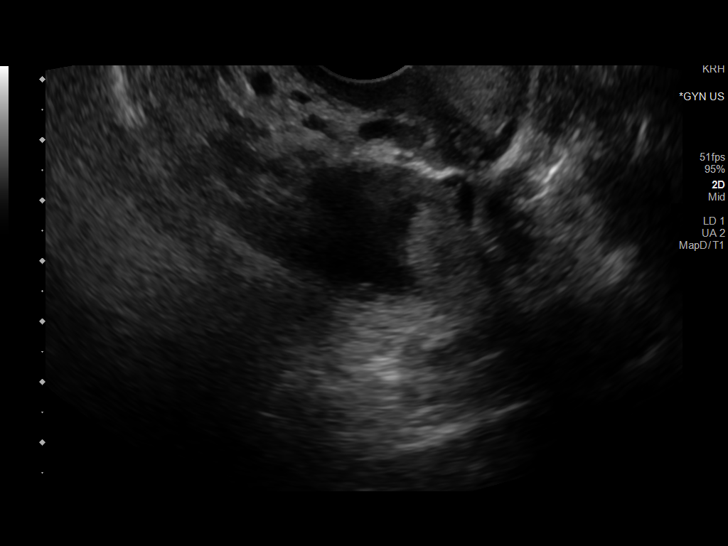
[im 49/65]
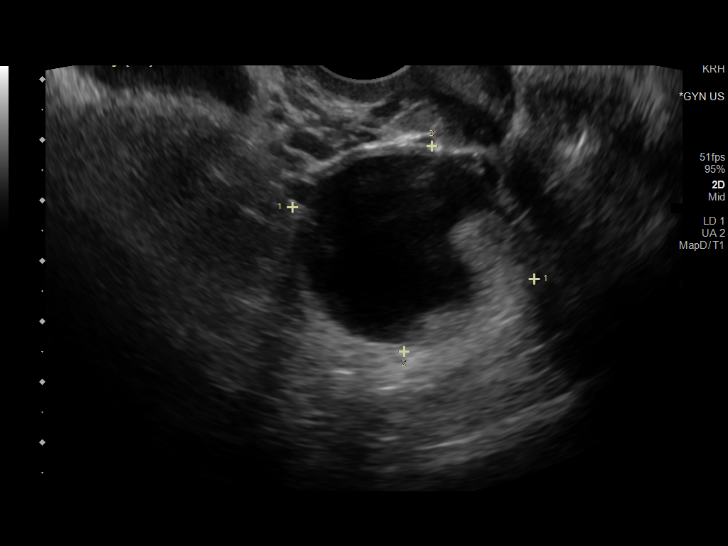
[im 54/65]
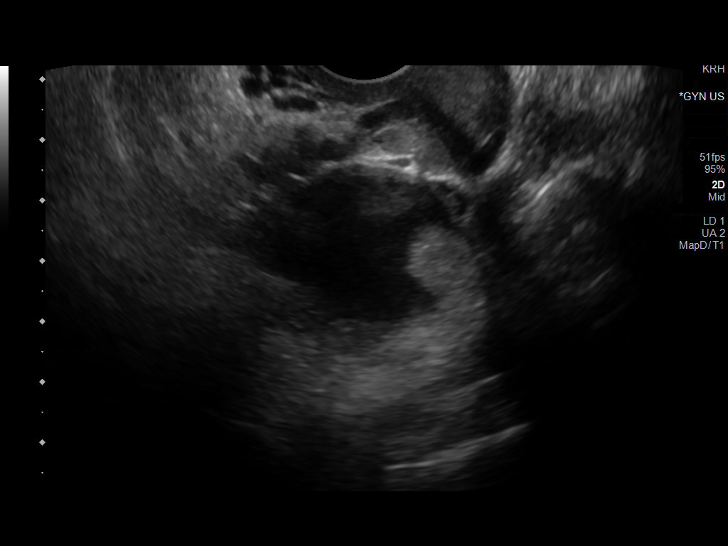
[im 59/65]
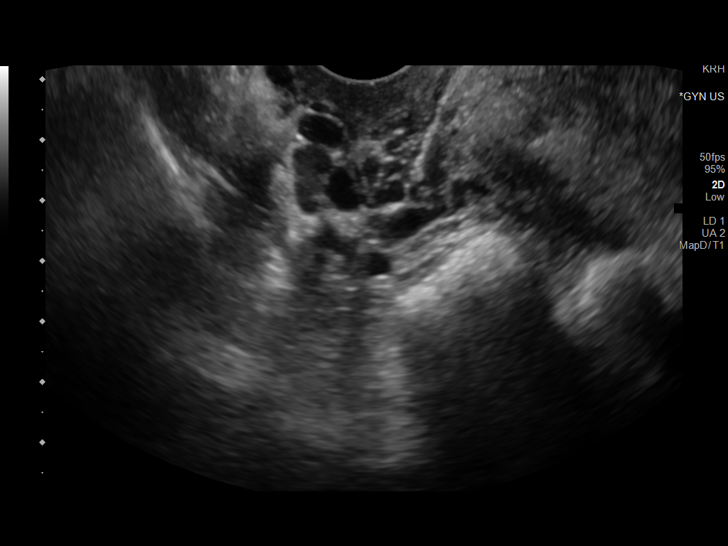
[im 65/65]
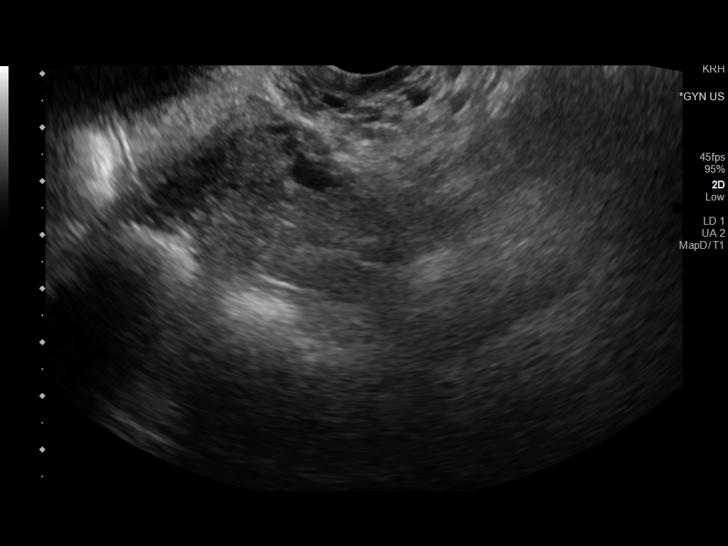

[13 of 25 positions shown; findings below may reference images not displayed]

FINDINGS: Uterus

Measurements: 8.3 x 4 x 4.6 cm = volume: 78.6 mL. Arising from
lateral uterus near the fundus is a lobular area measuring 2.7 x
x 2.4 cm with posterior acoustic shadowing. Smaller hypoechoic area
in the posterior uterus near the fundus measuring 0.7 x 0.7 x
cm.

Endometrium

Thickness: Fluid in the endometrium.  Endometrial thickness 5 mm.

Right ovary

Measurements: Not visualized

Left ovary

Measurements: 4.2 x 3.4 x 3.5 cm = volume: 25.9 mL. Area in the LEFT
ovary measuring 3.5 x 3.3 x 3.0 cm. Color Doppler flow at the
periphery. Nodular areas in side of this predominantly anechoic
structure measuring up to 7 mm in greatest thickness. Color Doppler
flow is not seen within the nodular intra cystic areas. Spectral
Doppler not performed.

Other findings

Debris in the urinary bladder.
IMPRESSION: 1. Thickness of the endometrium at the upper limits of normal with
small amount of fluid. If there is continued bleeding endometrial
sampling may be warranted.
2. Cystic structure in the LEFT adnexa with nodular components,
epithelial neoplasm is not excluded. For this and the above finding
follow-up MRI could potentially be helpful for further assessment.
3. RIGHT ovary not visualized.
4. Presumed leiomyomata in the uterus.
